# Patient Record
Sex: Female | Born: 1995
Health system: Southern US, Community
[De-identification: ages and names within clinical notes are randomized; demographics above are authoritative.]

## PROBLEM LIST (undated history)

## (undated) DIAGNOSIS — Z23 Encounter for immunization: Secondary | ICD-10-CM

## (undated) DIAGNOSIS — T7840XA Allergy, unspecified, initial encounter: Secondary | ICD-10-CM

## (undated) DIAGNOSIS — Z789 Other specified health status: Secondary | ICD-10-CM

## (undated) HISTORY — DX: Allergy, unspecified, initial encounter: T78.40XA

## (undated) HISTORY — DX: Other specified health status: Z78.9

## (undated) HISTORY — DX: Encounter for immunization: Z23

## (undated) HISTORY — PX: NO PAST SURGERIES: SHX2092

---
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
## 2021-09-23 ENCOUNTER — Ambulatory Visit (INDEPENDENT_AMBULATORY_CARE_PROVIDER_SITE_OTHER): Payer: Medicaid Other | Admitting: Advanced Practice Midwife

## 2021-09-23 ENCOUNTER — Other Ambulatory Visit (HOSPITAL_COMMUNITY)
Admission: RE | Admit: 2021-09-23 | Discharge: 2021-09-23 | Disposition: A | Discharge status: Home / Self Care | Payer: Medicaid Other | Source: Ambulatory Visit | Attending: Advanced Practice Midwife | Admitting: Advanced Practice Midwife

## 2021-09-23 ENCOUNTER — Other Ambulatory Visit: Payer: Self-pay

## 2021-09-23 ENCOUNTER — Other Ambulatory Visit (HOSPITAL_BASED_OUTPATIENT_CLINIC_OR_DEPARTMENT_OTHER): Payer: Self-pay

## 2021-09-23 ENCOUNTER — Encounter: Payer: Self-pay | Admitting: General Practice

## 2021-09-23 ENCOUNTER — Encounter: Payer: Self-pay | Admitting: Advanced Practice Midwife

## 2021-09-23 VITALS — BP 106/76 | HR 76 | Wt 137.0 lb

## 2021-09-23 DIAGNOSIS — Z3402 Encounter for supervision of normal first pregnancy, second trimester: Secondary | ICD-10-CM | POA: Insufficient documentation

## 2021-09-23 DIAGNOSIS — Z3400 Encounter for supervision of normal first pregnancy, unspecified trimester: Secondary | ICD-10-CM

## 2021-09-23 DIAGNOSIS — Z3A15 15 weeks gestation of pregnancy: Secondary | ICD-10-CM

## 2021-09-23 DIAGNOSIS — O0932 Supervision of pregnancy with insufficient antenatal care, second trimester: Secondary | ICD-10-CM | POA: Insufficient documentation

## 2021-09-23 MED ORDER — PRENATAL VITAMIN 27-0.8 MG PO TABS
1.0000 | ORAL_TABLET | Freq: Every day | ORAL | 12 refills | Status: DC
  Filled 2021-09-23: qty 100, 100d supply, fill #0

## 2021-09-23 NOTE — Progress Notes (Signed)
INITIAL OBSTETRICAL VISIT Patient name: Dawn Graves MRN [id number]  Date of birth: 1996/03/29 Chief Complaint:   Initial Prenatal Visit  History of Present Illness:   SAIRA KRAMME is a 25 y.o. G1P0 African American female at [redacted]w[redacted]d by [place] at 6 weeks with an Estimated Date of Delivery: 03/15/22 being seen today for her initial obstetrical visit.   Her obstetrical history is significant for slightly  late to care (15wks) .   Today she reports no complaints.    Patient's last menstrual period was 05/26/2021. Last pap today.   Review of Systems:   Pertinent items are noted in HPI Denies cramping/contractions, leakage of fluid, vaginal bleeding, abnormal vaginal discharge w/ itching/odor/irritation, headaches, visual changes, shortness of breath, chest pain, abdominal pain, severe nausea/vomiting, or problems with urination or bowel movements unless otherwise stated above.  Pertinent History Reviewed:  Reviewed past medical,surgical, social, obstetrical and family history.  Reviewed problem list, medications and allergies. OB History  Gravida Para Term Preterm AB Living  1            SAB IAB Ectopic Multiple Live Births               # Outcome Date GA Lbr Len/2nd Weight Sex Delivery Anes PTL Lv  1 Current            Physical Assessment:   Vitals:   09/23/21 0859  BP: 106/76  Pulse: 76  Weight: 137 lb (62.1 kg)  There is no height or weight on file to calculate BMI.       Physical Examination:  General appearance - well appearing, and in no distress  Mental status - alert, oriented to person, place, and time  Psych:  She has a normal mood and affect  Skin - warm and dry, normal color, no suspicious lesions noted  Chest - effort normal, all lung fields clear to auscultation bilaterally  Heart - normal rate and regular rhythm  Abdomen - soft, nontender  Extremities:  No swelling or varicosities noted  Pelvic - VULVA: normal appearing vulva with no masses, tenderness or  lesions  VAGINA: normal appearing vagina with normal color and discharge, no lesions  CERVIX: normal appearing cervix without discharge or lesions, no CMT  Thin prep pap is done   Chaperone:  Armandina Stammer RN      Assessment & Plan:  1) Low-Risk Pregnancy G1P0 at [redacted]w[redacted]d with an Estimated Date of Delivery: 03/15/22   2) Initial OB visit  3) Slightly late to care  Meds: PNV  Initial labs obtained Continue prenatal vitamins Reviewed n/v relief measures and warning s/s to report Reviewed recommended weight gain based on pre-gravid BMI Encouraged well-balanced diet Genetic & carrier screening discussed: requests Panorama, AFP, and Horizon , requests Panorama, AFP, and Horizon  Ultrasound discussed; fetal survey: requested CCNC completed> form faxed if has or is planning to apply for medicaid The nature of [employer] for [employer] with multiple MDs and other Advanced Practice Providers was explained to patient; also emphasized that fellows, residents, and students are part of our team. Given home bp cuff. . Check bp weekly, let us know if >140/90.   Indications for ASA therapy (per uptodate) One of the following: H/O preeclampsia, especially early onset/adverse outcome No Multifetal gestation No CHTN No T1DM or T2DM No Chronic kidney disease No Autoimmune disease (antiphospholipid syndrome, systemic lupus erythematosus) No  OR Two or more of the following: Nulliparity Yes Obesity (BMI>30 kg/m2) No Family  h/o preeclampsia in mother or sister No Age ?35 years No Sociodemographic characteristics (African American race, low socioeconomic level) Yes Personal risk factors (eg, previous pregnancy w/ LBW or SGA, previous adverse pregnancy outcome [eg, stillbirth], interval >10 years between pregnancies) No  Indications for early A1C (per uptodate) BMI >=25 (>=23 in Asian women) AND one of the following GDM in a previous pregnancy No Previous A1C?5.7, impaired glucose  tolerance, or impaired fasting glucose on previous testing No First-degree relative with diabetes No High-risk race/ethnicity (eg, African American, Latino, Native American, Asian American, Pacific Islander) Yes History of cardiovascular disease No HTN or on therapy for hypertension No HDL cholesterol level <35 mg/dL (4.54 mmol/L) and/or a triglyceride level >250 mg/dL (0.98 mmol/L) No PCOS No Physical inactivity No Other clinical condition associated with insulin resistance (eg, severe obesity, acanthosis nigricans) No Previous birth of an infant weighing ?4000 g No Previous stillbirth of unknown cause No >= 40yo No  Follow-up: Return in about 4 weeks (around 10/21/2021) for [employer].   Orders Placed This Encounter  Procedures   Urine Culture   [place] MFM OB COMP + 14 WK   CBC/D/Plt+RPR+Rh+ABO+RubIgG...   Hepatitis C Antibody   Genetic Screening    Wynelle Bourgeois CNM, [facility] 09/23/2021 9:49 AM

## 2021-09-23 NOTE — Progress Notes (Signed)
Patient states she was seen once in clinic in [place] and they gave her due date of 03/16/2022. Armandina Stammer RN

## 2021-09-24 LAB — CYTOLOGY - PAP
Chlamydia: NEGATIVE
Comment: NEGATIVE
Comment: NORMAL
Diagnosis: NEGATIVE
Neisseria Gonorrhea: NEGATIVE

## 2021-09-24 LAB — CBC/D/PLT+RPR+RH+ABO+RUBIGG...
Antibody Screen: NEGATIVE
Basophils Absolute: 0 10*3/uL (ref 0.0–0.2)
Basos: 0 %
EOS (ABSOLUTE): 0.6 10*3/uL — ABNORMAL HIGH (ref 0.0–0.4)
Eos: 5 %
HCV Ab: <0.1 s/co ratio (ref 0.0–0.9)
HIV Screen 4th Generation wRfx: NONREACTIVE
Hematocrit: 37.6 % (ref 34.0–46.6)
Hemoglobin: 13.1 g/dL (ref 11.1–15.9)
Hepatitis B Surface Ag: NEGATIVE
Immature Grans (Abs): 0.1 10*3/uL (ref 0.0–0.1)
Immature Granulocytes: 1 %
Lymphocytes Absolute: 1.9 10*3/uL (ref 0.7–3.1)
Lymphs: 16 %
MCH: 33.3 pg — ABNORMAL HIGH (ref 26.6–33.0)
MCHC: 34.8 g/dL (ref 31.5–35.7)
MCV: 96 fL (ref 79–97)
Monocytes Absolute: 0.6 10*3/uL (ref 0.1–0.9)
Monocytes: 5 %
Neutrophils Absolute: 9.2 10*3/uL — ABNORMAL HIGH (ref 1.4–7.0)
Neutrophils: 73 %
Platelets: 179 10*3/uL (ref 150–450)
RBC: 3.93 x10E6/uL (ref 3.77–5.28)
RDW: 12 % (ref 11.7–15.4)
RPR Ser Ql: NONREACTIVE
Rh Factor: POSITIVE
Rubella Antibodies, IGG: 4.47 index (ref >0.99)
WBC: 12.5 10*3/uL — ABNORMAL HIGH (ref 3.4–10.8)

## 2021-09-24 LAB — HCV INTERPRETATION

## 2021-09-24 LAB — HEPATITIS C ANTIBODY: Hep C Virus Ab: <0.1 s/co ratio (ref 0.0–0.9)

## 2021-09-25 ENCOUNTER — Other Ambulatory Visit: Payer: Self-pay

## 2021-09-26 LAB — URINE CULTURE

## 2021-10-06 ENCOUNTER — Telehealth: Payer: Self-pay

## 2021-10-06 NOTE — Telephone Encounter (Signed)
Called pt to discuss Panorama results. Pt made aware that her Dawn Graves is low risk. Pt does not want to know the gender.Understanding was voiced. Karter Haire l Arihant Pennings, CMA

## 2021-10-21 ENCOUNTER — Ambulatory Visit (INDEPENDENT_AMBULATORY_CARE_PROVIDER_SITE_OTHER): Payer: Medicaid Other | Admitting: Advanced Practice Midwife

## 2021-10-21 ENCOUNTER — Encounter: Payer: Self-pay | Admitting: Advanced Practice Midwife

## 2021-10-21 ENCOUNTER — Other Ambulatory Visit: Payer: Self-pay

## 2021-10-21 VITALS — BP 111/76 | HR 97 | Wt 138.0 lb

## 2021-10-21 DIAGNOSIS — Z3A19 19 weeks gestation of pregnancy: Secondary | ICD-10-CM | POA: Diagnosis not present

## 2021-10-21 DIAGNOSIS — Z3402 Encounter for supervision of normal first pregnancy, second trimester: Secondary | ICD-10-CM | POA: Diagnosis not present

## 2021-10-21 NOTE — Progress Notes (Signed)
   PRENATAL VISIT NOTE  Subjective:  Dawn Graves is a 25 y.o. G1P0 at [redacted]w[redacted]d being seen today for ongoing prenatal care.  She is currently monitored for the following issues for this low-risk pregnancy and has Sports physical; Right hamstring muscle strain; Encounter for supervision of normal first pregnancy in second trimester; and Late prenatal care affecting pregnancy in second trimester on their problem list.  Patient reports no complaints.  Contractions: Not present. Vag. Bleeding: None.  Movement: Present. Denies leaking of fluid.   The following portions of the patient's history were reviewed and updated as appropriate: allergies, current medications, past family history, past medical history, past social history, past surgical history and problem list.   Objective:   Vitals:   10/21/21 1102  BP: 111/76  Pulse: 97  Weight: 138 lb (62.6 kg)    Fetal Status: Fetal Heart Rate (bpm): 161   Movement: Present     General:  Alert, oriented and cooperative. Patient is in no acute distress.  Skin: Skin is warm and dry. No rash noted.   Cardiovascular: Normal heart rate noted  Respiratory: Normal respiratory effort, no problems with respiration noted  Abdomen: Soft, gravid, appropriate for gestational age.  Pain/Pressure: Absent     Pelvic: Cervical exam deferred        Extremities: Normal range of motion.  Edema: None  Mental Status: Normal mood and affect. Normal behavior. Normal judgment and thought content.   Assessment and Plan:  Pregnancy: G1P0 at [redacted]w[redacted]d 1. [redacted] weeks gestation of pregnancy  - AFP, Serum, Open Spina Bifida  2. Encounter for supervision of normal first pregnancy in second trimester      Panorama low risk  - AFP, Serum, Open Spina Bifida  Preterm labor symptoms and general obstetric precautions including but not limited to vaginal bleeding, contractions, leaking of fluid and fetal movement were reviewed in detail with the patient. Please refer to After  Visit Summary for other counseling recommendations.   Return in about 4 weeks (around 11/18/2021) for [employer].  Future Appointments  Date Time Provider Department Center  10/22/2021 10:45 AM [facility] US5 [facility]    Wynelle Bourgeois, CNM

## 2021-10-22 ENCOUNTER — Ambulatory Visit (HOSPITAL_BASED_OUTPATIENT_CLINIC_OR_DEPARTMENT_OTHER): Payer: Medicaid Other | Admitting: Maternal & Fetal Medicine

## 2021-10-22 ENCOUNTER — Ambulatory Visit: Payer: Medicaid Other | Attending: Advanced Practice Midwife

## 2021-10-22 ENCOUNTER — Other Ambulatory Visit: Payer: Self-pay | Admitting: Advanced Practice Midwife

## 2021-10-22 DIAGNOSIS — Z3A19 19 weeks gestation of pregnancy: Secondary | ICD-10-CM | POA: Diagnosis not present

## 2021-10-22 DIAGNOSIS — O3503X0 Maternal care for (suspected) central nervous system malformation or damage in fetus, choroid plexus cysts, not applicable or unspecified: Secondary | ICD-10-CM | POA: Diagnosis not present

## 2021-10-22 DIAGNOSIS — Z3689 Encounter for other specified antenatal screening: Secondary | ICD-10-CM | POA: Diagnosis not present

## 2021-10-22 DIAGNOSIS — O358XX0 Maternal care for other (suspected) fetal abnormality and damage, not applicable or unspecified: Secondary | ICD-10-CM

## 2021-10-22 DIAGNOSIS — Z3400 Encounter for supervision of normal first pregnancy, unspecified trimester: Secondary | ICD-10-CM

## 2021-10-22 DIAGNOSIS — Z3A15 15 weeks gestation of pregnancy: Secondary | ICD-10-CM | POA: Diagnosis not present

## 2021-10-22 NOTE — Progress Notes (Signed)
Ms. Slabach is a G1P0 here at 19w 3d who is dated by a 6 w 3 d ultrasound.   Single intrauterine pregnancy here for a detailed anatomy due to bilateral choroid plexus cyst. Normal anatomy with measurements consistent with dates There is good fetal movement and amniotic fluid volume  Ms. McKnigh had a low risk NIPS  Choroid plexus cysts (CPCs) are well-demarcated, anechoic, fluid-filled structures within the choroid plexus of the lateral ventricles of the brain. They are not true cysts in the pathologic sense. CPCs are often called "soft sonographic signs" or "markers" of aneuploidy, because some studies have found an association between them and fetal chromosomal abnormalities.    When the CPCs are isolated, some consider them an anatomic variant.  The incidence ranges from 0.18% to 3.6% of fetuses scanned in the second trimester. Regardless, the potential clinical implications were reviewed with your patient. CPCs may be an isolated finding or associated with fetal anomalies. Associated anomalies are typically those seen with trisomy 18 and include congenital heart disease, clenched hands, single umbilical artery, intrauterine growth restriction, and rocker bottom feet. A CPC is not a congenital brain defect.  In general, when a choroid plexus cyst is noted as an isolated finding and no other high-risk issues are noted, invasive karyotype analysis via an amniocentesis is usually not recommended.   Ms. Brackeen understood today's counseling and had no further questions  I spent 20 minutes with > 50% in face to face counseling.  Novella Olive, MD

## 2021-10-23 LAB — AFP, SERUM, OPEN SPINA BIFIDA
AFP MoM: 0.83
AFP Value: 51.6 ng/mL
Gest. Age on Collection Date: 19.2 weeks
Maternal Age At EDD: 23.6 yr
OSBR Risk 1 IN: 10000
Test Results:: NEGATIVE
Weight: 138 [lb_av]

## 2021-11-13 NOTE — Progress Notes (Signed)
   PRENATAL VISIT NOTE  Subjective:  Dawn Graves is a 25 y.o. G1P0 at [redacted]w[redacted]d being seen today for ongoing prenatal care.  She is currently monitored for the following issues for this low-risk pregnancy and has Sports physical; Right hamstring muscle strain; Encounter for supervision of normal first pregnancy in second trimester; and Late prenatal care affecting pregnancy in second trimester on their problem list.  Patient reports no complaints.  Contractions: Not present. Vag. Bleeding: None.  Movement: Present. Denies leaking of fluid.   The following portions of the patient's history were reviewed and updated as appropriate: allergies, current medications, past family history, past medical history, past social history, past surgical history and problem list.   Objective:   Vitals:   11/19/21 1032  BP: 117/73  Pulse: (!) 104  Weight: 144 lb (65.3 kg)    Fetal Status: Fetal Heart Rate (bpm): 140 Fundal Height: 23 cm Movement: Present     General:  Alert, oriented and cooperative. Patient is in no acute distress.  Skin: Skin is warm and dry. No rash noted.   Cardiovascular: Normal heart rate noted  Respiratory: Normal respiratory effort, no problems with respiration noted  Abdomen: Soft, gravid, appropriate for gestational age.  Pain/Pressure: Present     Pelvic: Cervical exam deferred        Extremities: Normal range of motion.  Edema: None  Mental Status: Normal mood and affect. Normal behavior. Normal judgment and thought content.   Assessment and Plan:  Pregnancy: G1P0 at [redacted]w[redacted]d 1. Encounter for supervision of normal first pregnancy in second trimester Anatomy showed CPCs but isolated finding and normal NIPS. Discussed with pt.  MSAFP wnl 28w labs at next appt  Preterm labor symptoms and general obstetric precautions including but not limited to vaginal bleeding, contractions, leaking of fluid and fetal movement were reviewed in detail with the patient. Please refer to  After Visit Summary for other counseling recommendations.   Return in about 4 weeks (around 12/17/2021) for 2 hr GTT.  No future appointments.   Milas Hock, MD

## 2021-11-19 ENCOUNTER — Other Ambulatory Visit: Payer: Self-pay

## 2021-11-19 ENCOUNTER — Encounter: Payer: Self-pay | Admitting: Obstetrics and Gynecology

## 2021-11-19 ENCOUNTER — Ambulatory Visit (INDEPENDENT_AMBULATORY_CARE_PROVIDER_SITE_OTHER): Payer: Medicaid Other | Admitting: Obstetrics and Gynecology

## 2021-11-19 VITALS — BP 117/73 | HR 104 | Wt 144.0 lb

## 2021-11-19 DIAGNOSIS — Z3402 Encounter for supervision of normal first pregnancy, second trimester: Secondary | ICD-10-CM

## 2021-11-19 DIAGNOSIS — Z3A23 23 weeks gestation of pregnancy: Secondary | ICD-10-CM

## 2021-12-14 NOTE — L&D Delivery Note (Signed)
Delivery Note ? ? ?FHR 180, moderate variability and deep variables down to 90s w/pushes. Vtx visible at introitus for about an hour, plenty of room, but maternal pushing effort poor.  Offered a vacuum and pt accepted. Dr Shawnie Pons in room. ?Verbal consent: obtained from patient. Risks and benefits discussed in detail. Risks include, but are not limited to the risks of anesthesia, bleeding, infection, damage to maternal tissues, fetal cephalhematoma. There is also the risk of inability to effect vaginal delivery of the head, or shoulder dystocia that cannot be resolved by established maneuvers, leading to the need for emergency cesarean section ?Kiwi placed and pumped to green range.  Over the next contraction, at 10:46 PM a viable female was delivered via Vaginal, Vacuum Investment banker, operational) (Presentation: Middle Occiput Anterior).  APGAR: 8, 9; weight 6 lb 5.9 oz (2890 g).  After 1 minute, the cord was clamped and cut. 40 units of pitocin diluted in 1000cc LR was infused rapidly IV.  The placenta separated spontaneously and delivered via CCT and maternal pushing effort.  It was inspected and appears to be intact with a 3 VC.  ? ?Anesthesia: Epidural ?Episiotomy: None ?Lacerations: 2nd degree ?Suture Repair: 2.0 vicryl ?Est. Blood Loss (mL):   ? ?Mom to postpartum.  Baby to NICU. ? ?Dawn Graves ?03/19/2022, 11:50 PM ? ? ? ?

## 2021-12-16 ENCOUNTER — Other Ambulatory Visit: Payer: Self-pay

## 2021-12-16 ENCOUNTER — Ambulatory Visit (INDEPENDENT_AMBULATORY_CARE_PROVIDER_SITE_OTHER): Payer: Medicaid Other | Admitting: Advanced Practice Midwife

## 2021-12-16 ENCOUNTER — Encounter: Payer: Self-pay | Admitting: General Practice

## 2021-12-16 VITALS — BP 103/76 | HR 83 | Wt 142.0 lb

## 2021-12-16 DIAGNOSIS — Z3A27 27 weeks gestation of pregnancy: Secondary | ICD-10-CM

## 2021-12-16 DIAGNOSIS — Z3402 Encounter for supervision of normal first pregnancy, second trimester: Secondary | ICD-10-CM

## 2021-12-16 NOTE — Progress Notes (Signed)
° °  PRENATAL VISIT NOTE  Subjective:  Dawn Graves is a 26 y.o. G1P0 at [redacted]w[redacted]d being seen today for ongoing prenatal care.  She is currently monitored for the following issues for this low-risk pregnancy and has Sports physical; Right hamstring muscle strain; Encounter for supervision of normal first pregnancy in second trimester; and Late prenatal care affecting pregnancy in second trimester on their problem list.  Patient reports no complaints.  Contractions: Not present. Vag. Bleeding: None.  Movement: Present. Denies leaking of fluid.   The following portions of the patient's history were reviewed and updated as appropriate: allergies, current medications, past family history, past medical history, past social history, past surgical history and problem list.   Objective:   Vitals:   12/16/21 1044  BP: 103/76  Pulse: 83  Weight: 142 lb (64.4 kg)    Fetal Status: Fetal Heart Rate (bpm): 147   Movement: Present     General:  Alert, oriented and cooperative. Patient is in no acute distress.  Skin: Skin is warm and dry. No rash noted.   Cardiovascular: Normal heart rate noted  Respiratory: Normal respiratory effort, no problems with respiration noted  Abdomen: Soft, gravid, appropriate for gestational age.  Pain/Pressure: Present     Pelvic: Cervical exam deferred        Extremities: Normal range of motion.  Edema: None  Mental Status: Normal mood and affect. Normal behavior. Normal judgment and thought content.   Assessment and Plan:  Pregnancy: G1P0 at [redacted]w[redacted]d 1. Encounter for supervision of normal first pregnancy in second trimester  - CBC - Glucose Tolerance, 2 Hours w/1 Hour - HIV Antibody (routine testing w rflx) - RPR  2. [redacted] weeks gestation of pregnancy  - CBC - Glucose Tolerance, 2 Hours w/1 Hour - HIV Antibody (routine testing w rflx) - RPR  Preterm labor symptoms and general obstetric precautions including but not limited to vaginal bleeding, contractions,  leaking of fluid and fetal movement were reviewed in detail with the patient. Please refer to After Visit Summary for other counseling recommendations.   Return in about 2 weeks (around 12/30/2021) for [facility].  Future Appointments  Date Time Provider Department Center  12/30/2021 10:15 AM Aviva Signs, CNM [facility] None  01/13/2022 10:35 AM Aviva Signs, CNM [facility] None  01/27/2022 10:35 AM Aviva Signs, CNM [facility] None  02/09/2022 10:35 AM Milas Hock, MD [facility] None    Wynelle Bourgeois, CNM

## 2021-12-17 LAB — CBC
Hematocrit: 35.4 % (ref 34.0–46.6)
Hemoglobin: 12.5 g/dL (ref 11.1–15.9)
MCH: 33.1 pg — ABNORMAL HIGH (ref 26.6–33.0)
MCHC: 35.3 g/dL (ref 31.5–35.7)
MCV: 94 fL (ref 79–97)
Platelets: 173 10*3/uL (ref 150–450)
RBC: 3.78 x10E6/uL (ref 3.77–5.28)
RDW: 11.5 % — ABNORMAL LOW (ref 11.7–15.4)
WBC: 13.5 10*3/uL — ABNORMAL HIGH (ref 3.4–10.8)

## 2021-12-17 LAB — RPR: RPR Ser Ql: NONREACTIVE

## 2021-12-17 LAB — GLUCOSE TOLERANCE, 2 HOURS W/ 1HR
Glucose, 1 hour: 84 mg/dL (ref 70–179)
Glucose, 2 hour: 62 mg/dL — ABNORMAL LOW (ref 70–152)
Glucose, Fasting: 67 mg/dL — ABNORMAL LOW (ref 70–91)

## 2021-12-17 LAB — HIV ANTIBODY (ROUTINE TESTING W REFLEX): HIV Screen 4th Generation wRfx: NONREACTIVE

## 2021-12-30 ENCOUNTER — Other Ambulatory Visit: Payer: Self-pay

## 2021-12-30 ENCOUNTER — Ambulatory Visit (INDEPENDENT_AMBULATORY_CARE_PROVIDER_SITE_OTHER): Payer: Medicaid Other | Admitting: Advanced Practice Midwife

## 2021-12-30 ENCOUNTER — Encounter: Payer: Self-pay | Admitting: Advanced Practice Midwife

## 2021-12-30 VITALS — BP 102/68 | HR 120 | Wt 144.0 lb

## 2021-12-30 DIAGNOSIS — Z3402 Encounter for supervision of normal first pregnancy, second trimester: Secondary | ICD-10-CM

## 2021-12-30 DIAGNOSIS — Z3A29 29 weeks gestation of pregnancy: Secondary | ICD-10-CM

## 2021-12-31 NOTE — Progress Notes (Signed)
° °  PRENATAL VISIT NOTE  Subjective:  Dawn Graves is a 26 y.o. G1P0 at [redacted]w[redacted]d being seen today for ongoing prenatal care.  She is currently monitored for the following issues for this low-risk pregnancy and has Sports physical; Right hamstring muscle strain; Encounter for supervision of normal first pregnancy in second trimester; and Late prenatal care affecting pregnancy in second trimester on their problem list.  Patient reports no complaints.  Contractions: Not present. Vag. Bleeding: None.  Movement: Present. Denies leaking of fluid.   The following portions of the patient's history were reviewed and updated as appropriate: allergies, current medications, past family history, past medical history, past social history, past surgical history and problem list.   Objective:   Vitals:   12/30/21 1020  BP: 102/68  Pulse: (!) 120  Weight: 144 lb (65.3 kg)    Fetal Status: Fetal Heart Rate (bpm): 150   Movement: Present     General:  Alert, oriented and cooperative. Patient is in no acute distress.  Skin: Skin is warm and dry. No rash noted.   Cardiovascular: Normal heart rate noted  Respiratory: Normal respiratory effort, no problems with respiration noted  Abdomen: Soft, gravid, appropriate for gestational age.  Pain/Pressure: Present     Pelvic: Cervical exam deferred        Extremities: Normal range of motion.  Edema: None  Mental Status: Normal mood and affect. Normal behavior. Normal judgment and thought content.   Assessment and Plan:  Pregnancy: G1P0 at [redacted]w[redacted]d There are no diagnoses linked to this encounter. Preterm labor symptoms and general obstetric precautions including but not limited to vaginal bleeding, contractions, leaking of fluid and fetal movement were reviewed in detail with the patient. Please refer to After Visit Summary for other counseling recommendations.   Return in about 2 weeks (around 01/13/2022) for [facility].  Future Appointments  Date  Time Provider Department Center  01/13/2022 10:35 AM Aviva Signs, CNM [facility] None  01/27/2022 10:35 AM Aviva Signs, CNM [facility] None  02/09/2022 10:35 AM Milas Hock, MD [facility] None    Wynelle Bourgeois, CNM

## 2022-01-13 ENCOUNTER — Ambulatory Visit (INDEPENDENT_AMBULATORY_CARE_PROVIDER_SITE_OTHER): Payer: Medicaid Other | Admitting: Advanced Practice Midwife

## 2022-01-13 ENCOUNTER — Other Ambulatory Visit: Payer: Self-pay

## 2022-01-13 ENCOUNTER — Encounter: Payer: Self-pay | Admitting: Advanced Practice Midwife

## 2022-01-13 VITALS — BP 104/66 | HR 108 | Wt 148.0 lb

## 2022-01-13 DIAGNOSIS — Z3A31 31 weeks gestation of pregnancy: Secondary | ICD-10-CM | POA: Diagnosis not present

## 2022-01-13 DIAGNOSIS — Z23 Encounter for immunization: Secondary | ICD-10-CM

## 2022-01-13 DIAGNOSIS — Z3493 Encounter for supervision of normal pregnancy, unspecified, third trimester: Secondary | ICD-10-CM

## 2022-01-13 NOTE — Progress Notes (Signed)
° °  PRENATAL VISIT NOTE  Subjective:  Dawn Graves is a 26 y.o. G1P0 at 105w2d being seen today for ongoing prenatal care.  She is currently monitored for the following issues for this low-risk pregnancy and has Sports physical; Right hamstring muscle strain; Encounter for supervision of normal first pregnancy in second trimester; and Late prenatal care affecting pregnancy in second trimester on their problem list.  Patient reports no complaints.  Contractions: Not present. Vag. Bleeding: None.  Movement: Present. Denies leaking of fluid.   The following portions of the patient's history were reviewed and updated as appropriate: allergies, current medications, past family history, past medical history, past social history, past surgical history and problem list.   Objective:   Vitals:   01/13/22 1042  BP: 104/66  Pulse: (!) 108  Weight: 148 lb (67.1 kg)    Fetal Status: Fetal Heart Rate (bpm): 137   Movement: Present     General:  Alert, oriented and cooperative. Patient is in no acute distress.  Skin: Skin is warm and dry. No rash noted.   Cardiovascular: Normal heart rate noted  Respiratory: Normal respiratory effort, no problems with respiration noted  Abdomen: Soft, gravid, appropriate for gestational age.  Pain/Pressure: Absent     Pelvic: Cervical exam deferred        Extremities: Normal range of motion.  Edema: Trace  Mental Status: Normal mood and affect. Normal behavior. Normal judgment and thought content.   Assessment and Plan:  Pregnancy: G1P0 at [redacted]w[redacted]d 1. [redacted] weeks gestation of pregnancy      Signed Waterbirth Consent today       Reviewed issues surrounding waterbirth - Tdap vaccine greater than or equal to 7yo IM  Preterm labor symptoms and general obstetric precautions including but not limited to vaginal bleeding, contractions, leaking of fluid and fetal movement were reviewed in detail with the patient. Please refer to After Visit Summary for other counseling  recommendations.     Future Appointments  Date Time Provider Department Center  01/27/2022 10:35 AM Aviva Signs, CNM [facility] None  02/09/2022 10:35 AM Milas Hock, MD [facility] None    Wynelle Bourgeois, CNM

## 2022-01-14 ENCOUNTER — Encounter: Payer: Self-pay | Admitting: General Practice

## 2022-01-18 ENCOUNTER — Encounter: Payer: Self-pay | Admitting: Advanced Practice Midwife

## 2022-01-20 ENCOUNTER — Telehealth: Payer: Self-pay

## 2022-01-20 ENCOUNTER — Encounter: Payer: Self-pay | Admitting: Advanced Practice Midwife

## 2022-01-20 ENCOUNTER — Other Ambulatory Visit: Payer: Self-pay

## 2022-01-20 DIAGNOSIS — O368120 Decreased fetal movements, second trimester, not applicable or unspecified: Secondary | ICD-10-CM

## 2022-01-20 NOTE — Telephone Encounter (Signed)
Patient called and made aware that we are working on mfm consult for the concerns over her fetal movements. Armandina Stammer RN

## 2022-01-27 ENCOUNTER — Other Ambulatory Visit: Payer: Self-pay

## 2022-01-27 ENCOUNTER — Ambulatory Visit: Payer: Medicaid Other

## 2022-01-27 ENCOUNTER — Other Ambulatory Visit: Payer: Self-pay | Admitting: Advanced Practice Midwife

## 2022-01-27 ENCOUNTER — Ambulatory Visit (HOSPITAL_BASED_OUTPATIENT_CLINIC_OR_DEPARTMENT_OTHER): Payer: Medicaid Other

## 2022-01-27 ENCOUNTER — Encounter: Payer: Self-pay | Admitting: Advanced Practice Midwife

## 2022-01-27 ENCOUNTER — Ambulatory Visit (INDEPENDENT_AMBULATORY_CARE_PROVIDER_SITE_OTHER): Payer: Medicaid Other | Admitting: Advanced Practice Midwife

## 2022-01-27 ENCOUNTER — Ambulatory Visit: Payer: Medicaid Other | Attending: Advanced Practice Midwife | Admitting: *Deleted

## 2022-01-27 VITALS — BP 93/65 | HR 94

## 2022-01-27 VITALS — BP 101/65 | HR 75 | Wt 147.0 lb

## 2022-01-27 DIAGNOSIS — Z3402 Encounter for supervision of normal first pregnancy, second trimester: Secondary | ICD-10-CM

## 2022-01-27 DIAGNOSIS — Z3A33 33 weeks gestation of pregnancy: Secondary | ICD-10-CM

## 2022-01-27 DIAGNOSIS — O368130 Decreased fetal movements, third trimester, not applicable or unspecified: Secondary | ICD-10-CM | POA: Diagnosis not present

## 2022-01-27 DIAGNOSIS — O368120 Decreased fetal movements, second trimester, not applicable or unspecified: Secondary | ICD-10-CM

## 2022-01-27 DIAGNOSIS — Z362 Encounter for other antenatal screening follow-up: Secondary | ICD-10-CM | POA: Diagnosis not present

## 2022-01-27 NOTE — Progress Notes (Signed)
° °  PRENATAL VISIT NOTE  Subjective:  Dawn Graves is a 26 y.o. G1P0 at [redacted]w[redacted]d being seen today for ongoing prenatal care.  She is currently monitored for the following issues for this low-risk pregnancy and has Sports physical; Right hamstring muscle strain; Encounter for supervision of normal first pregnancy in second trimester; and Late prenatal care affecting pregnancy in second trimester on their problem list.  Patient reports no complaints.  Contractions: Not present. Vag. Bleeding: None.  Movement: Present. Denies leaking of fluid.   The following portions of the patient's history were reviewed and updated as appropriate: allergies, current medications, past family history, past medical history, past social history, past surgical history and problem list.   Objective:   Vitals:   01/27/22 1039  BP: 101/65  Pulse: 75  Weight: 147 lb (66.7 kg)    Fetal Status: Fetal Heart Rate (bpm): 132   Movement: Present     General:  Alert, oriented and cooperative. Patient is in no acute distress.  Skin: Skin is warm and dry. No rash noted.   Cardiovascular: Normal heart rate noted  Respiratory: Normal respiratory effort, no problems with respiration noted  Abdomen: Soft, gravid, appropriate for gestational age.  Pain/Pressure: Present     Pelvic: Cervical exam deferred        Extremities: Normal range of motion.  Edema: None  Mental Status: Normal mood and affect. Normal behavior. Normal judgment and thought content.   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 Preterm labor symptoms and general obstetric precautions including but not limited to vaginal bleeding, contractions, leaking of fluid and fetal movement were reviewed in detail with the patient. Please refer to After Visit Summary for other counseling recommendations.   Return in about 2 weeks (around 02/10/2022) for [facility].  Future Appointments  Date Time Provider Department Center  02/09/2022 10:35 AM Milas Hock, MD [facility] None  02/17/2022 10:35 AM Gerrit Heck, CNM [facility] None  02/24/2022 10:35 AM Gerrit Heck, CNM [facility] None  02/24/2022  1:15 PM [facility] NURSE [facility]  02/24/2022  1:30 PM [facility] US3 [facility]  03/03/2022 11:15 AM Gerrit Heck, CNM [facility] None  03/10/2022  9:15 AM Aviva Signs, CNM [facility] None    Wynelle Bourgeois, CNM

## 2022-01-28 ENCOUNTER — Other Ambulatory Visit: Payer: Self-pay | Admitting: *Deleted

## 2022-01-28 DIAGNOSIS — O283 Abnormal ultrasonic finding on antenatal screening of mother: Secondary | ICD-10-CM

## 2022-02-02 ENCOUNTER — Ambulatory Visit: Payer: Medicaid Other

## 2022-02-09 ENCOUNTER — Ambulatory Visit (INDEPENDENT_AMBULATORY_CARE_PROVIDER_SITE_OTHER): Payer: Medicaid Other | Admitting: Obstetrics and Gynecology

## 2022-02-09 ENCOUNTER — Other Ambulatory Visit: Payer: Self-pay

## 2022-02-09 VITALS — BP 105/64 | HR 87 | Wt 151.0 lb

## 2022-02-09 DIAGNOSIS — Z3402 Encounter for supervision of normal first pregnancy, second trimester: Secondary | ICD-10-CM

## 2022-02-09 DIAGNOSIS — Z3A35 35 weeks gestation of pregnancy: Secondary | ICD-10-CM

## 2022-02-09 DIAGNOSIS — O0932 Supervision of pregnancy with insufficient antenatal care, second trimester: Secondary | ICD-10-CM

## 2022-02-09 NOTE — Progress Notes (Addendum)
° °  PRENATAL VISIT NOTE  Subjective:  Dawn Graves is a 26 y.o. G1P0 at [redacted]w[redacted]d being seen today for ongoing prenatal care.  She is currently monitored for the following issues for this low-risk pregnancy and has Sports physical; Right hamstring muscle strain; Encounter for supervision of normal first pregnancy in second trimester; and Late prenatal care affecting pregnancy in second trimester on their problem list.  Patient reports no complaints.  Contractions: Irritability. Vag. Bleeding: None.  Movement: Present. Denies leaking of fluid.   The following portions of the patient's history were reviewed and updated as appropriate: allergies, current medications, past family history, past medical history, past social history, past surgical history and problem list.   Objective:   Vitals:   02/09/22 1039  BP: 105/64  Pulse: 87  Weight: 151 lb (68.5 kg)    Fetal Status: Fetal Heart Rate (bpm): 145 Fundal Height: 34 cm Movement: Present     General:  Alert, oriented and cooperative. Patient is in no acute distress.  Skin: Skin is warm and dry. No rash noted.   Cardiovascular: Normal heart rate noted  Respiratory: Normal respiratory effort, no problems with respiration noted  Abdomen: Soft, gravid, appropriate for gestational age.  Pain/Pressure: Absent     Pelvic: Cervical exam deferred        Extremities: Normal range of motion.  Edema: None  Mental Status: Normal mood and affect. Normal behavior. Normal judgment and thought content.   Assessment and Plan:  Pregnancy: G1P0 at [redacted]w[redacted]d 1. Encounter for supervision of normal first pregnancy in second trimester - GBS nv - normal gtt - Choroid plexus cysts resolved, growth was normal (4lbl13oz, 44%ile). Has f/u growth on 3/14 for her reassurance.   2. Late prenatal care affecting pregnancy in second trimester   Preterm labor symptoms and general obstetric precautions including but not limited to vaginal bleeding, contractions, leaking  of fluid and fetal movement were reviewed in detail with the patient. Please refer to After Visit Summary for other counseling recommendations.   No follow-ups on file.  Future Appointments  Date Time Provider Department Center  02/17/2022 10:35 AM Gerrit Heck, CNM [facility] None  02/24/2022 10:35 AM Gerrit Heck, CNM [facility] None  02/24/2022  1:15 PM [facility] NURSE [facility]  02/24/2022  1:30 PM [facility] US3 [facility]  03/03/2022 11:15 AM Gerrit Heck, CNM [facility] None  03/10/2022  9:15 AM Aviva Signs, CNM [facility] None    Milas Hock, MD

## 2022-02-17 ENCOUNTER — Other Ambulatory Visit: Payer: Self-pay

## 2022-02-17 ENCOUNTER — Other Ambulatory Visit (HOSPITAL_COMMUNITY)
Admission: RE | Admit: 2022-02-17 | Discharge: 2022-02-17 | Disposition: A | Discharge status: Home / Self Care | Payer: Medicaid Other | Source: Ambulatory Visit

## 2022-02-17 ENCOUNTER — Ambulatory Visit (INDEPENDENT_AMBULATORY_CARE_PROVIDER_SITE_OTHER): Payer: Medicaid Other

## 2022-02-17 VITALS — BP 105/68 | HR 71 | Wt 150.0 lb

## 2022-02-17 DIAGNOSIS — Z3402 Encounter for supervision of normal first pregnancy, second trimester: Secondary | ICD-10-CM

## 2022-02-17 DIAGNOSIS — Z3A36 36 weeks gestation of pregnancy: Secondary | ICD-10-CM | POA: Diagnosis not present

## 2022-02-17 DIAGNOSIS — O26843 Uterine size-date discrepancy, third trimester: Secondary | ICD-10-CM

## 2022-02-17 NOTE — Progress Notes (Signed)
? ?  LOW-RISK PREGNANCY OFFICE VISIT ? ?Patient name: Dawn Graves MRN [id number]  Date of birth: June 23, 1996 ?Chief Complaint:   ?Routine Prenatal Visit ? ?Subjective:   ?Dawn Graves is a 26 y.o. G1P0 female at [redacted]w[redacted]d with an Estimated Date of Delivery: 03/15/22 being seen today for ongoing management of a low-risk pregnancy aeb has Sports physical; Right hamstring muscle strain; Encounter for supervision of normal first pregnancy in second trimester; and Late prenatal care affecting pregnancy in second trimester on their problem list. ? ?Patient presents today with, her mother, and has no complaints.  Patient endorses fetal movement. Patient is unsure if she is experiencing abdominal cramping or contractions.  She reports "little discomforts." Patient denies vaginal concerns including abnormal discharge, leaking of fluid, and bleeding.  Contractions: Irritability. Vag. Bleeding: None.  Movement: Present. ? ?Reviewed past medical,surgical, social, obstetrical and family history as well as problem list, medications and allergies. ? ?Objective  ? ?Vitals:  ? 02/17/22 1043  ?BP: 105/68  ?Pulse: 71  ?Weight: 150 lb (68 kg)  ?There is no height or weight on file to calculate BMI.  ?Total Weight Gain:28 lb (12.7 kg) ? ?  ?     Physical Examination:  ? General appearance: Well appearing, and in no distress ? Mental status: Alert, oriented to person, place, and time ? Skin: Warm & dry ? Cardiovascular: Normal heart rate noted ? Respiratory: Normal respiratory effort, no distress ? Abdomen: Soft, gravid, nontender, AGA with Fundal Height: 31 cm ? Pelvic: Cervical exam deferred          ? Extremities: Edema: Trace ? ?Fetal Status: Fetal Heart Rate (bpm): 136  Movement: Present  ? ?No results found for this or any previous visit (from the past 24 hour(s)).  ?Assessment & Plan:  ?Low-risk pregnancy of a 26 y.o., G1P0 at [redacted]w[redacted]d with an Estimated Date of Delivery: 03/15/22  ? ?1. Encounter for supervision of normal first  pregnancy in second trimester ?-Anticipatory guidance for upcoming appts. ?-Patient to schedule next appt in 1 weeks for an in-person visit. ? ? ?3. [redacted] weeks gestation of pregnancy ?-Doing Well ?-GBS and Gc/CT cultures collected ?-Continues to desire WB. Remains candidate ? ? ?4. Uterine size-date discrepancy in third trimester ?-FH at 31cm which is decrease from previous measurements. ?-Leopolds without distinguishable presenting part in pelvic. ?-Patient informed that discrepancy could be from fetal position or small infant. ?-Next [place] scheduled for March 14. ? ? ?  ?Meds: No orders of the defined types were placed in this encounter. ? ?Labs/procedures today: Lab Orders    ?     Culture, beta strep (group b only)     ? ?Reviewed: Preterm labor symptoms and general obstetric precautions including but not limited to vaginal bleeding, contractions, leaking of fluid and fetal movement were reviewed in detail with the patient.  All questions were answered. ? ?Follow-up: Return in about 1 week (around 02/24/2022) for LROB. ? ?Orders Placed This Encounter  ?Procedures  ? Culture, beta strep (group b only)  ? ?Cherre Robins MSN, CNM ?02/17/2022 ? ?

## 2022-02-18 LAB — GC/CHLAMYDIA PROBE AMP (~~LOC~~) NOT AT ARMC
Chlamydia: NEGATIVE
Comment: NEGATIVE
Comment: NORMAL
Neisseria Gonorrhea: NEGATIVE

## 2022-02-21 LAB — CULTURE, BETA STREP (GROUP B ONLY): Strep Gp B Culture: NEGATIVE

## 2022-02-24 ENCOUNTER — Ambulatory Visit: Payer: Medicaid Other | Admitting: *Deleted

## 2022-02-24 ENCOUNTER — Other Ambulatory Visit: Payer: Self-pay | Admitting: Obstetrics and Gynecology

## 2022-02-24 ENCOUNTER — Other Ambulatory Visit: Payer: Self-pay

## 2022-02-24 ENCOUNTER — Ambulatory Visit (INDEPENDENT_AMBULATORY_CARE_PROVIDER_SITE_OTHER): Payer: Medicaid Other

## 2022-02-24 ENCOUNTER — Ambulatory Visit: Payer: Medicaid Other | Attending: Obstetrics and Gynecology

## 2022-02-24 VITALS — BP 101/75 | HR 94 | Wt 154.0 lb

## 2022-02-24 VITALS — BP 107/64 | HR 69

## 2022-02-24 DIAGNOSIS — Z362 Encounter for other antenatal screening follow-up: Secondary | ICD-10-CM | POA: Diagnosis not present

## 2022-02-24 DIAGNOSIS — O368130 Decreased fetal movements, third trimester, not applicable or unspecified: Secondary | ICD-10-CM | POA: Diagnosis not present

## 2022-02-24 DIAGNOSIS — O26843 Uterine size-date discrepancy, third trimester: Secondary | ICD-10-CM

## 2022-02-24 DIAGNOSIS — Z3A37 37 weeks gestation of pregnancy: Secondary | ICD-10-CM

## 2022-02-24 DIAGNOSIS — Z9289 Personal history of other medical treatment: Secondary | ICD-10-CM | POA: Insufficient documentation

## 2022-02-24 DIAGNOSIS — O358XX0 Maternal care for other (suspected) fetal abnormality and damage, not applicable or unspecified: Secondary | ICD-10-CM

## 2022-02-24 DIAGNOSIS — Z3402 Encounter for supervision of normal first pregnancy, second trimester: Secondary | ICD-10-CM

## 2022-02-24 DIAGNOSIS — O283 Abnormal ultrasonic finding on antenatal screening of mother: Secondary | ICD-10-CM | POA: Insufficient documentation

## 2022-02-24 DIAGNOSIS — O4103X1 Oligohydramnios, third trimester, fetus 1: Secondary | ICD-10-CM | POA: Insufficient documentation

## 2022-02-24 NOTE — Progress Notes (Signed)
? ?  LOW-RISK PREGNANCY OFFICE VISIT ? ?Patient name: Dawn Graves MRN [id number]  Date of birth: 1996-04-16 ?Chief Complaint:   ?Routine Prenatal Visit ? ?Subjective:   ?Dawn Graves is a 26 y.o. G1P0 female at [redacted]w[redacted]d with an Estimated Date of Delivery: 03/15/22 being seen today for ongoing management of a low-risk pregnancy aeb has Sports physical; Right hamstring muscle strain; Encounter for supervision of normal first pregnancy in second trimester; and Late prenatal care affecting pregnancy in second trimester on their problem list. ? ?Patient presents today with, her mother, and has no complaints.  Patient endorses fetal movement. Patient denies abdominal cramping or contractions.  Patient denies vaginal concerns including abnormal discharge, leaking of fluid, and bleeding. No issues with urination or diarrhea, but endorses constipation with hard to pass stools.   Contractions: Not present. Vag. Bleeding: None.  Movement: Present. ? ?Reviewed past medical,surgical, social, obstetrical and family history as well as problem list, medications and allergies. ? ?Objective  ? ?Vitals:  ? 02/24/22 1041  ?BP: 101/75  ?Pulse: 94  ?Weight: 154 lb (69.9 kg)  ?There is no height or weight on file to calculate BMI.  ?Total Weight Gain:32 lb (14.5 kg) ? ?  ?     Physical Examination:  ? General appearance: Well appearing, and in no distress ? Mental status: Alert, oriented to person, place, and time ? Skin: Warm & dry ? Cardiovascular: Normal heart rate noted ? Respiratory: Normal respiratory effort, no distress ? Abdomen: Soft, gravid, nontender, SGA with Fundal Height: 31 cm ? Pelvic: Cervical exam deferred        Presentation: Undeterminable by leopolds ? Extremities: Edema: Trace ? ?Fetal Status: Fetal Heart Rate (bpm): 131  Movement: Present  ? ?No results found for this or any previous visit (from the past 24 hour(s)).  ?Assessment & Plan:  ?Low-risk pregnancy of a 26 y.o., G1P0 at [redacted]w[redacted]d with an Estimated Date of  Delivery: 03/15/22  ? ?1. Encounter for supervision of normal first pregnancy in second trimester ?-Anticipatory guidance for upcoming appts. ?-Patient to schedule next appt in 1 weeks for an in-person visit. ?-Reviewed GBS negative result. ? ?2. [redacted] weeks gestation of pregnancy ?-Doing well. ?-Reports constipation improved with usage of squatty potty. ?-Encouraged intake of fiber and water in diet. ? ?3. Uterine size-date discrepancy in third trimester ?-FH at 31cm ?-[place] scheduled for today. ?-Informed that if infant found to be IUGR or malpresentation WB candidacy would be reversed.  ?-Patient verbalizes understanding and without questions.  ? ? ?  ?Meds: No orders of the defined types were placed in this encounter. ? ?Labs/procedures today:  ?Lab Orders  ?No laboratory test(s) ordered today  ?  ? ?Reviewed: Term labor symptoms and general obstetric precautions including but not limited to vaginal bleeding, contractions, leaking of fluid and fetal movement were reviewed in detail with the patient.  All questions were answered. ? ?Follow-up: Return in about 1 week (around 03/03/2022) for LROB. ? ?No orders of the defined types were placed in this encounter. ? ?Cherre Robins MSN, CNM ?02/24/2022 ? ?

## 2022-02-24 NOTE — Procedures (Signed)
Dawn Graves ?14-Jan-1996 ?[redacted]w[redacted]d ? ?Fetus A Non-Stress Test Interpretation for 02/24/22 ? ?Indication: Unsatisfactory BPP ? ?Fetal Heart Rate A ?Mode: External ?Baseline Rate (A): 135 bpm ?Variability: Moderate ?Accelerations: 15 x 15 ?Decelerations: None ?Multiple birth?: No ? ?Uterine Activity ?Mode: Palpation, Toco ?Contraction Frequency (min): Occas. ?Contraction Quality: Mild ?Resting Tone Palpated: Relaxed ?Resting Time: Adequate ? ?Interpretation (Fetal Testing) ?Nonstress Test Interpretation: Reactive ?Comments: Dr. Parke Poisson reviewed tracing. ? ? ?

## 2022-02-25 ENCOUNTER — Other Ambulatory Visit: Payer: Self-pay | Admitting: *Deleted

## 2022-02-25 DIAGNOSIS — O0933 Supervision of pregnancy with insufficient antenatal care, third trimester: Secondary | ICD-10-CM

## 2022-03-03 ENCOUNTER — Ambulatory Visit (INDEPENDENT_AMBULATORY_CARE_PROVIDER_SITE_OTHER): Payer: Medicaid Other

## 2022-03-03 ENCOUNTER — Other Ambulatory Visit: Payer: Self-pay

## 2022-03-03 VITALS — BP 104/78 | HR 84 | Wt 152.0 lb

## 2022-03-03 DIAGNOSIS — O4103X1 Oligohydramnios, third trimester, fetus 1: Secondary | ICD-10-CM

## 2022-03-03 DIAGNOSIS — Z3403 Encounter for supervision of normal first pregnancy, third trimester: Secondary | ICD-10-CM

## 2022-03-03 DIAGNOSIS — Z3A38 38 weeks gestation of pregnancy: Secondary | ICD-10-CM

## 2022-03-03 NOTE — Progress Notes (Signed)
? ?  LOW-RISK PREGNANCY OFFICE VISIT ? ?Patient name: Dawn Graves MRN [id number]  Date of birth: 06-19-1996 ?Chief Complaint:   ?Routine Prenatal Visit ? ?Subjective:   ?Dawn Graves is a 26 y.o. G1P0 female at [redacted]w[redacted]d with an Estimated Date of Delivery: 03/15/22 being seen today for ongoing management of a low-risk pregnancy aeb has Sports physical; Right hamstring muscle strain; Encounter for supervision of normal first pregnancy in second trimester; Late prenatal care affecting pregnancy in second trimester; and Low amniotic fluid, third trimester, fetus 1 on their problem list. ? ?Patient presents today, with her mother, and has concerns regarding her [place].  She questions if her fluid level is the reason she is measuring small.  She also questions WB eligibility.  Patient endorses fetal movement. Patient denies abdominal cramping or contractions.  Patient denies vaginal concerns including abnormal discharge, leaking of fluid, and bleeding. No issues with urination. Contractions: Not present. Vag. Bleeding: None.  Movement: Present. ? ?Reviewed past medical,surgical, social, obstetrical and family history as well as problem list, medications and allergies. ? ?Objective  ? ?Vitals:  ? 03/03/22 1124  ?BP: 104/78  ?Pulse: 84  ?Weight: 152 lb (68.9 kg)  ?There is no height or weight on file to calculate BMI.  ?Total Weight Gain:30 lb (13.6 kg) ? ?  ?     Physical Examination:  ? General appearance: Well appearing, and in no distress ? Mental status: Alert, oriented to person, place, and time ? Skin: Warm & dry ? Cardiovascular: Normal heart rate noted ? Respiratory: Normal respiratory effort, no distress ? Abdomen: Soft, gravid, nontender, SGA with Fundal Height: 34 cm ? Pelvic: Cervical exam deferred          ? Extremities: Edema: Trace ? ?Fetal Status: Fetal Heart Rate (bpm): 126  Movement: Present  ? ?No results found for this or any previous visit (from the past 24 hour(s)).  ?Assessment & Plan:  ?Low-risk  pregnancy of a 26 y.o., G1P0 at [redacted]w[redacted]d with an Estimated Date of Delivery: 03/15/22  ? ?1. Encounter for supervision of normal first pregnancy in third trimester ?-Anticipatory guidance for upcoming appts. ?-Patient to schedule next appt in 1 weeks for an in-person visit. ?-Discussed scheduling of IOL next week for 41 weeks. ?-Briefly reviewed and highly recommended outpatient foley bulb in case of induction as a means of initiation labor and making WB an option.  ? ?2. [redacted] weeks gestation of pregnancy ?-Doing well ?-Reassured that growth is appropriate and still currently a candidate for WB. ? ?3. Low amniotic fluid, third trimester, fetus 1 ?-AFI at 7th%ile on 3/14 ?-Recommendation for IOL if AFI </=5%ile or <2cm pocket. ?-Discussed recommendations and plan for same day induction if these findings are noted on her upcoming 3/23 exam. ?-informed that if spontaneous labor occurs before that time, she remains a candidate for WB. ?-Cautioned that oligohydramnios can contribute to NR fetal tracings.  ? ?  ?Meds: No orders of the defined types were placed in this encounter. ? ?Labs/procedures today:  ?Lab Orders  ?No laboratory test(s) ordered today  ?  ? ?Reviewed: Preterm labor symptoms and general obstetric precautions including but not limited to vaginal bleeding, contractions, leaking of fluid and fetal movement were reviewed in detail with the patient.  All questions were answered. ? ?Follow-up: Return in about 1 week (around 03/10/2022) for LROB. ? ?No orders of the defined types were placed in this encounter. ? ?Cherre Robins MSN, CNM ?03/03/2022 ? ?

## 2022-03-04 ENCOUNTER — Ambulatory Visit (HOSPITAL_BASED_OUTPATIENT_CLINIC_OR_DEPARTMENT_OTHER): Payer: Medicaid Other

## 2022-03-04 ENCOUNTER — Ambulatory Visit: Payer: Medicaid Other | Attending: Obstetrics | Admitting: *Deleted

## 2022-03-04 VITALS — BP 107/76 | HR 84

## 2022-03-04 DIAGNOSIS — O0933 Supervision of pregnancy with insufficient antenatal care, third trimester: Secondary | ICD-10-CM | POA: Insufficient documentation

## 2022-03-04 DIAGNOSIS — O26843 Uterine size-date discrepancy, third trimester: Secondary | ICD-10-CM

## 2022-03-04 DIAGNOSIS — O358XX0 Maternal care for other (suspected) fetal abnormality and damage, not applicable or unspecified: Secondary | ICD-10-CM

## 2022-03-04 DIAGNOSIS — Z3A38 38 weeks gestation of pregnancy: Secondary | ICD-10-CM | POA: Diagnosis not present

## 2022-03-04 DIAGNOSIS — O4103X1 Oligohydramnios, third trimester, fetus 1: Secondary | ICD-10-CM

## 2022-03-04 DIAGNOSIS — Z362 Encounter for other antenatal screening follow-up: Secondary | ICD-10-CM | POA: Insufficient documentation

## 2022-03-04 DIAGNOSIS — Z3402 Encounter for supervision of normal first pregnancy, second trimester: Secondary | ICD-10-CM

## 2022-03-05 ENCOUNTER — Ambulatory Visit: Payer: Medicaid Other

## 2022-03-05 ENCOUNTER — Other Ambulatory Visit: Payer: Medicaid Other

## 2022-03-10 ENCOUNTER — Encounter: Payer: Self-pay | Admitting: Advanced Practice Midwife

## 2022-03-10 ENCOUNTER — Ambulatory Visit (INDEPENDENT_AMBULATORY_CARE_PROVIDER_SITE_OTHER): Payer: Medicaid Other | Admitting: Advanced Practice Midwife

## 2022-03-10 ENCOUNTER — Other Ambulatory Visit: Payer: Self-pay

## 2022-03-10 VITALS — BP 120/80 | HR 66 | Wt 156.0 lb

## 2022-03-10 DIAGNOSIS — O4103X1 Oligohydramnios, third trimester, fetus 1: Secondary | ICD-10-CM

## 2022-03-10 DIAGNOSIS — Z3A39 39 weeks gestation of pregnancy: Secondary | ICD-10-CM

## 2022-03-10 NOTE — Progress Notes (Signed)
? ?  PRENATAL VISIT NOTE ? ?Subjective:  ?Dawn Graves is a 26 y.o. G1P0 at [redacted]w[redacted]d being seen today for ongoing prenatal care.  She is currently monitored for the following issues for this low-risk pregnancy and has Sports physical; Right hamstring muscle strain; Encounter for supervision of normal first pregnancy in second trimester; Late prenatal care affecting pregnancy in second trimester; and Low amniotic fluid, third trimester, fetus 1 on their problem list. ? ?Patient reports occasional contractions.  Contractions: Irregular. Vag. Bleeding: None.  Movement: Present. Denies leaking of fluid.  ? ?The following portions of the patient's history were reviewed and updated as appropriate: allergies, current medications, past family history, past medical history, past social history, past surgical history and problem list.  ? ?Objective:  ? ?Vitals:  ? 03/10/22 0916  ?BP: 120/80  ?Pulse: 66  ?Weight: 156 lb (70.8 kg)  ? ? ?Fetal Status: Fetal Heart Rate (bpm): 140   Movement: Present    ? ?General:  Alert, oriented and cooperative. Patient is in no acute distress.  ?Skin: Skin is warm and dry. No rash noted.   ?Cardiovascular: Normal heart rate noted  ?Respiratory: Normal respiratory effort, no problems with respiration noted  ?Abdomen: Soft, gravid, appropriate for gestational age.  Pain/Pressure: Present     ?Pelvic: Deferred       ?Extremities: Normal range of motion.  Edema: Trace  ?Mental Status: Normal mood and affect. Normal behavior. Normal judgment and thought content.  ? ?Assessment and Plan:  ?Pregnancy: G1P0 at [redacted]w[redacted]d ?1. [redacted] weeks gestation of pregnancy ?    Reviewed labor signs ?    Will play it by ear re: low amniotic fluid, fetal well being. Re: waterbirth ? ?2. Low amniotic fluid, third trimester, fetus 1 ?    Improved to 10.9 after 7.7 on 3/14   Has [place] scheduled tomorrow ? ?Term labor symptoms and general obstetric precautions including but not limited to vaginal bleeding, contractions, leaking of  fluid and fetal movement were reviewed in detail with the patient. ?Please refer to After Visit Summary for other counseling recommendations.  ? ?Return in about 1 week (around 03/17/2022) for [facility]. ? ?Future Appointments  ?Date Time Provider Department Center  ?03/11/2022  7:15 AM [facility] NURSE [facility]  ?03/11/2022  7:30 AM [facility] US2 [facility]  ?03/17/2022  9:35 AM Aviva Signs, CNM [facility] None  ? ? ?Wynelle Bourgeois, CNM ?

## 2022-03-11 ENCOUNTER — Ambulatory Visit: Payer: Medicaid Other | Admitting: *Deleted

## 2022-03-11 ENCOUNTER — Ambulatory Visit: Payer: Medicaid Other | Attending: Obstetrics

## 2022-03-11 ENCOUNTER — Encounter: Payer: Self-pay | Admitting: *Deleted

## 2022-03-11 VITALS — BP 120/84 | HR 70

## 2022-03-11 DIAGNOSIS — O26843 Uterine size-date discrepancy, third trimester: Secondary | ICD-10-CM

## 2022-03-11 DIAGNOSIS — O0933 Supervision of pregnancy with insufficient antenatal care, third trimester: Secondary | ICD-10-CM | POA: Diagnosis not present

## 2022-03-11 DIAGNOSIS — Z362 Encounter for other antenatal screening follow-up: Secondary | ICD-10-CM

## 2022-03-11 DIAGNOSIS — Z3A39 39 weeks gestation of pregnancy: Secondary | ICD-10-CM | POA: Diagnosis not present

## 2022-03-11 DIAGNOSIS — O26849 Uterine size-date discrepancy, unspecified trimester: Secondary | ICD-10-CM

## 2022-03-11 DIAGNOSIS — O358XX0 Maternal care for other (suspected) fetal abnormality and damage, not applicable or unspecified: Secondary | ICD-10-CM | POA: Diagnosis not present

## 2022-03-14 ENCOUNTER — Encounter (HOSPITAL_COMMUNITY): Payer: Self-pay | Admitting: Family Medicine

## 2022-03-14 ENCOUNTER — Inpatient Hospital Stay (HOSPITAL_COMMUNITY)
Admission: AD | Admit: 2022-03-14 | Discharge: 2022-03-14 | Disposition: A | Discharge status: Home / Self Care | Payer: Medicaid Other | Attending: Family Medicine | Admitting: Family Medicine

## 2022-03-14 ENCOUNTER — Other Ambulatory Visit: Payer: Self-pay

## 2022-03-14 DIAGNOSIS — O4103X1 Oligohydramnios, third trimester, fetus 1: Secondary | ICD-10-CM

## 2022-03-14 DIAGNOSIS — Z3689 Encounter for other specified antenatal screening: Secondary | ICD-10-CM

## 2022-03-14 DIAGNOSIS — O479 False labor, unspecified: Secondary | ICD-10-CM | POA: Diagnosis not present

## 2022-03-14 DIAGNOSIS — Z3402 Encounter for supervision of normal first pregnancy, second trimester: Secondary | ICD-10-CM

## 2022-03-14 DIAGNOSIS — Z3A39 39 weeks gestation of pregnancy: Secondary | ICD-10-CM | POA: Diagnosis not present

## 2022-03-14 DIAGNOSIS — O471 False labor at or after 37 completed weeks of gestation: Secondary | ICD-10-CM | POA: Insufficient documentation

## 2022-03-14 NOTE — MAU Provider Note (Addendum)
Per RN: Ms. Dawn Graves is a 26 y.o. G1P0 at [redacted]w[redacted]d  who presents to MAU today complaining of regular contractions minutes since midnight. No VB or LOF. +FM.  ? ?O: BP 107/77   Pulse 83   Temp 98 ?F (36.7 ?C) (Oral)   Resp 12   Ht 5\' 11"  (1.803 m)   Wt 70.3 kg   LMP 05/26/2021   SpO2 97%   BMI 21.62 kg/m?  ? ?Cervical exam:  ?Dilation: Fingertip ?Effacement (%): 70 ?Cervical Position: Posterior ?Exam by:: M. Zebedee Segundo,CNM ? ?Fetal Monitoring: ?Baseline: 130 ?Variability: mod ?Accelerations: + ?Decelerations: no ?Contractions: q3-7 ? ?MDM: no cervical change. Stable for discharge.  ? ?A: ?SIUP at [redacted]w[redacted]d  ?False labor ?Reactive NST ? ?P: ?Discharge home ?Follow up @[facility] as scheduled in 2 days ?Labor precautions ?FMCs ? ?[redacted]w[redacted]d, CNM ?03/14/2022 8:47 AM ? ?

## 2022-03-14 NOTE — MAU Note (Signed)
Pt is G1 at 39.6wks. Reports having ctxs for the last wk which were not painful. Having more uncomfortable ctxs since . Denies LOF. Some bloody show. Good FM. No recent sve ?

## 2022-03-17 ENCOUNTER — Telehealth: Payer: Self-pay

## 2022-03-17 ENCOUNTER — Ambulatory Visit (INDEPENDENT_AMBULATORY_CARE_PROVIDER_SITE_OTHER): Payer: Medicaid Other | Admitting: Advanced Practice Midwife

## 2022-03-17 VITALS — BP 111/80 | HR 82 | Wt 152.0 lb

## 2022-03-17 DIAGNOSIS — O4103X1 Oligohydramnios, third trimester, fetus 1: Secondary | ICD-10-CM

## 2022-03-17 DIAGNOSIS — Z3402 Encounter for supervision of normal first pregnancy, second trimester: Secondary | ICD-10-CM

## 2022-03-17 NOTE — Progress Notes (Signed)
? ?  PRENATAL VISIT NOTE ? ?Subjective:  ?Dawn Graves is a 26 y.o. G1P0 at [redacted]w[redacted]d being seen today for ongoing prenatal care.  She is currently monitored for the following issues for this low-risk pregnancy and has Sports physical; Right hamstring muscle strain; Encounter for supervision of normal first pregnancy in second trimester; Late prenatal care affecting pregnancy in second trimester; and Low amniotic fluid, third trimester, fetus 1 on their problem list. ? ?Patient reports occasional contractions.  Contractions: Irregular. Vag. Bleeding: Scant.  Movement: Present. Denies leaking of fluid.  ? ?The following portions of the patient's history were reviewed and updated as appropriate: allergies, current medications, past family history, past medical history, past social history, past surgical history and problem list.  ? ?Objective:  ? ?Vitals:  ? 03/17/22 0938  ?BP: 111/80  ?Pulse: 82  ?Weight: 152 lb (68.9 kg)  ? ? ?Fetal Status: Fetal Heart Rate (bpm): 140   Movement: Present    ? ?General:  Alert, oriented and cooperative. Patient is in no acute distress.  ?Skin: Skin is warm and dry. No rash noted.   ?Cardiovascular: Normal heart rate noted  ?Respiratory: Normal respiratory effort, no problems with respiration noted  ?Abdomen: Soft, gravid, appropriate for gestational age.  Pain/Pressure: Present     ?Pelvic: Cervical exam deferred        ?Extremities: Normal range of motion.  Edema: Trace  ?Mental Status: Normal mood and affect. Normal behavior. Normal judgment and thought content.  ? ?Assessment and Plan:  ?Pregnancy: G1P0 at [redacted]w[redacted]d ?1. Encounter for supervision of normal first pregnancy in second trimester ?    Labor precautions and postdates IOL discussed ? ?2. Low amniotic fluid, third trimester, fetus 1 ?   Improved last [place] to 8.7 (18%) ?   Will schedule IOL this week per recommendations ? ?Term labor symptoms and general obstetric precautions including but not limited to vaginal bleeding,  contractions, leaking of fluid and fetal movement were reviewed in detail with the patient. ?Please refer to After Visit Summary for other counseling recommendations.  ? ?No follow-ups on file. ? ?Future Appointments  ?Date Time Provider [place]  ?03/22/2022 12:00 AM [facility] SCHED ROOM [facility] None  ?04/28/2022 11:15 AM Seabron Spates, CNM [facility] None  ? ? ?Hansel Feinstein, CNM ?

## 2022-03-17 NOTE — Telephone Encounter (Signed)
Left message for patient to return call to office.  ?Wynelle Bourgeois CNM would like her induction date moved up due to MFM recommendations.  ? ?Patient will be induced on 03/18/2022 in the AM. Armandina Stammer RN  ?

## 2022-03-18 ENCOUNTER — Inpatient Hospital Stay (HOSPITAL_COMMUNITY): Payer: Medicaid Other

## 2022-03-18 ENCOUNTER — Inpatient Hospital Stay (HOSPITAL_COMMUNITY)
Admission: AD | Admit: 2022-03-18 | Discharge: 2022-03-21 | DRG: 805 | Disposition: A | Discharge status: Home / Self Care | Payer: Medicaid Other | Attending: Family Medicine | Admitting: Family Medicine

## 2022-03-18 ENCOUNTER — Encounter (HOSPITAL_COMMUNITY): Payer: Self-pay | Admitting: Family Medicine

## 2022-03-18 DIAGNOSIS — O411231 Chorioamnionitis, third trimester, fetus 1: Secondary | ICD-10-CM | POA: Diagnosis not present

## 2022-03-18 DIAGNOSIS — O411230 Chorioamnionitis, third trimester, not applicable or unspecified: Secondary | ICD-10-CM | POA: Diagnosis not present

## 2022-03-18 DIAGNOSIS — O480 Post-term pregnancy: Principal | ICD-10-CM | POA: Diagnosis present

## 2022-03-18 DIAGNOSIS — Z3A40 40 weeks gestation of pregnancy: Secondary | ICD-10-CM | POA: Diagnosis not present

## 2022-03-18 DIAGNOSIS — O4103X1 Oligohydramnios, third trimester, fetus 1: Secondary | ICD-10-CM | POA: Diagnosis present

## 2022-03-18 DIAGNOSIS — O0932 Supervision of pregnancy with insufficient antenatal care, second trimester: Secondary | ICD-10-CM

## 2022-03-18 LAB — CBC
HCT: 36 % (ref 36.0–46.0)
Hemoglobin: 12.5 g/dL (ref 12.0–15.0)
MCH: 32.6 pg (ref 26.0–34.0)
MCHC: 34.7 g/dL (ref 30.0–36.0)
MCV: 94 fL (ref 80.0–100.0)
Platelets: 215 10*3/uL (ref 150–400)
RBC: 3.83 MIL/uL — ABNORMAL LOW (ref 3.87–5.11)
RDW: 12.6 % (ref 11.5–15.5)
WBC: 13.2 10*3/uL — ABNORMAL HIGH (ref 4.0–10.5)
nRBC: 0 % (ref 0.0–0.2)

## 2022-03-18 LAB — TYPE AND SCREEN
ABO/RH(D): B POS
Antibody Screen: NEGATIVE

## 2022-03-18 MED ORDER — ONDANSETRON HCL 4 MG/2ML IJ SOLN: 4.0000 mg | Freq: Four times a day (QID) | INTRAMUSCULAR | Status: DC | PRN

## 2022-03-18 MED ORDER — OXYTOCIN BOLUS FROM INFUSION
333.0000 mL | Freq: Once | INTRAVENOUS | Status: AC
  Administered 2022-03-19: 333 mL via INTRAVENOUS

## 2022-03-18 MED ORDER — SOD CITRATE-CITRIC ACID 500-334 MG/5ML PO SOLN: 30.0000 mL | ORAL | Status: DC | PRN

## 2022-03-18 MED ORDER — MISOPROSTOL 50MCG HALF TABLET
50.0000 ug | ORAL_TABLET | ORAL | Status: DC | PRN
Start: 2022-03-18 — End: 2022-03-20
  Administered 2022-03-18 – 2022-03-19 (×2): 50 ug via BUCCAL
  Filled 2022-03-18 (×3): qty 1

## 2022-03-18 MED ORDER — ACETAMINOPHEN 325 MG PO TABS: 650.0000 mg | ORAL_TABLET | ORAL | Status: DC | PRN

## 2022-03-18 MED ORDER — LACTATED RINGERS IV SOLN
500.0000 mL | INTRAVENOUS | Status: DC | PRN
  Administered 2022-03-19: 1000 mL via INTRAVENOUS
  Administered 2022-03-20: 500 mL via INTRAVENOUS
  Filled 2022-03-18: qty 1000

## 2022-03-18 MED ORDER — TERBUTALINE SULFATE 1 MG/ML IJ SOLN
0.2500 mg | Freq: Once | INTRAMUSCULAR | Status: DC | PRN
  Filled 2022-03-18: qty 1

## 2022-03-18 MED ORDER — OXYTOCIN-SODIUM CHLORIDE 30-0.9 UT/500ML-% IV SOLN
2.5000 [IU]/h | INTRAVENOUS | Status: DC
  Administered 2022-03-19: 2.5 [IU]/h via INTRAVENOUS
  Filled 2022-03-18: qty 500

## 2022-03-18 MED ORDER — LACTATED RINGERS IV SOLN: INTRAVENOUS | Status: DC

## 2022-03-18 MED ORDER — LIDOCAINE HCL (PF) 1 % IJ SOLN
30.0000 mL | INTRAMUSCULAR | Status: DC | PRN
Start: 2022-03-18 — End: 2022-03-20
  Filled 2022-03-18: qty 30

## 2022-03-18 NOTE — Progress Notes (Signed)
Patient ID: Dawn Graves, female   DOB: [birth year], 26 y.o.   MRN: [id number] ? ?In to introduce myself to pt and family; she is s/p cytotec x 1 dose and is feeling some of her ctx ? ?BP 115/78, P 70 ?FHR 120s, +accels, no decels, Cat 1 ?Ctx q 2-5 mins ?Cx deferred ? ?IUP@40 .3wks ?Low nl AFI ?Cx unfavorable ? ?She is interested in eating some light food prior to having her next cx exam; may shower as well; will plan to place cervical foley and repeat cytotec dose when she is ready ? ?Arabella Merles CNM ?03/18/2022 ?7:45 PM ? ?

## 2022-03-18 NOTE — Progress Notes (Signed)
Patient ID: Dawn Graves, female   DOB: 04-01-1996, 26 y.o.   MRN: [id number] ? ?S/p cytotec x 1 dose; feeling ctx stronger now ? ?BP 125/94, 117/87 ?FHR 120-130s, +accels, occ variables ?Ctx 2-4 mins ?Cx behind head/3cm/80%/vtx -2 ? ?IUP@40 .3wks ?Low normal AFI ?Cx now favorable ? ?Will manage expectantly for now; offered Pitocin however her preference is to wait and see if ctx increase spontaneously; will reassess ? ?Myrtis Ser CNM ?03/18/2022 ? ?

## 2022-03-18 NOTE — H&P (Addendum)
OBSTETRIC ADMISSION HISTORY AND PHYSICAL ? ?Dawn Graves is a 26 y.o. female G1P0 with IUP at [redacted]w[redacted]d by 8wk [place] presenting for Labor at term. She reports +FMs, No LOF, no VB, no blurry vision, headaches or peripheral edema, and RUQ pain.  She plans on breast feeding. She is undecided on birth control now with plans for abstinence. ?She received her [facility] at [facility]  ? ?Dating: By Brigid Re [place] --->  Estimated Date of Delivery: 03/15/22 ? ?Sono:   ? ?@[redacted]w[redacted]d , CWD, normal anatomy, Cephalic presentation,  2981g, 39% EFW ? ? ?Prenatal History/Complications:  ?-Late prenatal care ?-Fetal Choroid plexus cyst  ?-LR NIPS ?-Low amniotic fluid in 3rd trimester ? ?Past Medical History: ?Past Medical History:  ?Diagnosis Date  ? Immunization, varicella   ? Lab confirmed immunity on 07/02/2016, results sent for scanning  ? Medical history non-contributory   ? ? ?Past Surgical History: ?Past Surgical History:  ?Procedure Laterality Date  ? NO PAST SURGERIES    ? ? ?Obstetrical History: ?OB History   ? ? Gravida  ?1  ? Para  ?   ? Term  ?   ? Preterm  ?   ? AB  ?   ? Living  ?   ?  ? ? SAB  ?   ? IAB  ?   ? Ectopic  ?   ? Multiple  ?   ? Live Births  ?   ?   ?  ?  ? ? ?Social History ?Social History  ? ?Socioeconomic History  ? Marital status: Single  ?  Spouse name: Not on file  ? Number of children: Not on file  ? Years of education: Not on file  ? Highest education level: High school graduate  ?Occupational History  ? Not on file  ?Tobacco Use  ? Smoking status: Never  ? Smokeless tobacco: Never  ?Vaping Use  ? Vaping Use: Former  ? Substances: THC  ?Substance and Sexual Activity  ? Alcohol use: Not Currently  ?  Comment: not while preg  ? Drug use: No  ? Sexual activity: Yes  ?Other Topics Concern  ? Not on file  ?Social History Narrative  ? Not on file  ? ?Social Determinants of Health  ? ?Financial Resource Strain: Not on file  ?Food Insecurity: Not on file  ?Transportation Needs: Not on file  ?Physical Activity: Not on file   ?Stress: Not on file  ?Social Connections: Not on file  ? ? ?Family History: ?Family History  ?Problem Relation Age of Onset  ? Hypertension Maternal Grandmother   ? Diabetes Maternal Grandmother   ? Hypertension Paternal Grandmother   ? Diabetes Paternal Grandmother   ? Sudden death Neg Hx   ? Heart attack Neg Hx   ? Cancer Neg Hx   ? ? ?Allergies: ?Allergies  ?Allergen Reactions  ? Strawberry Extract   ? ? ?Pt denies allergies to latex, iodine, or shellfish. ? ?Medications Prior to Admission  ?Medication Sig Dispense Refill Last Dose  ? Prenatal Vit-Fe Fumarate-FA (PRENATAL VITAMIN) 27-0.8 MG TABS Take 1 tablet by mouth daily at 6 (six) AM. 100 tablet 12   ? ? ? ?Review of Systems  ? ?All systems reviewed and negative except as stated in HPI ? ?Blood pressure 110/82, pulse 93, temperature 99.1 ?F (37.3 ?C), temperature source Oral, resp. rate 15, height 5\' 11"  (1.803 m), weight 68.9 kg, last menstrual period 05/26/2021. ?General appearance: alert, cooperative, and appears stated age ?Lungs: clear  to auscultation bilaterally ?Heart: regular rate and rhythm ?Abdomen: soft, non-tender; bowel sounds normal ?Extremities: Homans sign is negative, no sign of DVT ?Presentation: cephalic ?Fetal monitoringBaseline: 130 bpm, Variability: Good {> 6 bpm), and Accelerations: Reactive ?Uterine activity. Cramps every  ?  ? ? ?Prenatal labs: ?ABO, Rh: B/Positive/-- (10/11 1009) ?Antibody: Negative (10/11 1009) ?Rubella: 4.47 (10/11 1009) ?RPR: Non Reactive (01/03 0828)  ?HBsAg: Negative (10/11 1009)  ?HIV: Non Reactive (01/03 4315)  ?GBS: Negative/-- (03/07 1125)  ?1 hr Glucola: Pass ?Genetic screening:  Normal  ?Anatomy US: Normal ? ?Prenatal Transfer Tool  ?Maternal Diabetes: No ?Genetic Screening: Normal ?Maternal Ultrasounds/Referrals: Normal ?Fetal Ultrasounds or other Referrals:  None ?Maternal Substance Abuse:  No ?Significant Maternal Medications:  None ?Significant Maternal Lab Results: Group B Strep  negativev ? ?No results found for this or any previous visit (from the past 24 hour(s)). ? ?Patient Active Problem List  ? Diagnosis Date Noted  ? Labor and delivery indication for care or intervention 03/18/2022  ? Low amniotic fluid, third trimester, fetus 1 02/24/2022  ? Encounter for supervision of normal first pregnancy in second trimester 09/23/2021  ? Late prenatal care affecting pregnancy in second trimester 09/23/2021  ? Right hamstring muscle strain 04/11/2015  ? Sports physical 10/26/2011  ? ? ?Assessment/Plan:  ?[name] is a 26 y.o. G1P0 at [redacted]w[redacted]d here for SVD at term  ? ?#Labor:Patient is progressing well without induction. Check by her nurse Elane Fritz showed cervix is currently 1cm dilated. Will start her on Cytotec with plan to reassess in 3-4 hours. Will consider Balloon at the time.  ?#Pain: IV Fentanyl  ?#FWB: Cat 1 ?#ID:  GBS negative  ?#MOF: Breast feeding  ?#MOC: Undecided, plan abstinence ? ? ?Jerre Simon, MD  ?Center for [facility], [facility] ?03/18/2022, 1:37 PM ? ?Attestation of Supervision of Student:  I confirm that I have verified the information documented in the resident's note and that I have also personally reperformed the history, physical exam and all medical decision making activities.  I have verified that all services and findings are accurately documented in this student's note; and I agree with management and plan as outlined in the documentation. I have also made any necessary editorial changes. ? ?--Cat I tracing ?--Preemptive discussion: If Pitocin needed and patient continues to desire waterbirth, can trial d/c Pit and assess for spontaneous labor to facilitate eligibility for waterbirth ? ?Calvert Cantor, CNM ?Center for [employer], [facility] ?03/18/2022 4:07 PM ? ? ? ?

## 2022-03-19 ENCOUNTER — Inpatient Hospital Stay (HOSPITAL_COMMUNITY): Payer: Medicaid Other | Admitting: Anesthesiology

## 2022-03-19 DIAGNOSIS — Z3A40 40 weeks gestation of pregnancy: Secondary | ICD-10-CM | POA: Diagnosis not present

## 2022-03-19 DIAGNOSIS — O411230 Chorioamnionitis, third trimester, not applicable or unspecified: Secondary | ICD-10-CM | POA: Diagnosis not present

## 2022-03-19 DIAGNOSIS — O480 Post-term pregnancy: Secondary | ICD-10-CM | POA: Diagnosis not present

## 2022-03-19 LAB — RPR: RPR Ser Ql: NONREACTIVE

## 2022-03-19 MED ORDER — EPHEDRINE 5 MG/ML INJ
10.0000 mg | INTRAVENOUS | Status: DC | PRN
  Filled 2022-03-19: qty 2

## 2022-03-19 MED ORDER — LACTATED RINGERS IV SOLN
INTRAVENOUS | Status: DC | PRN
  Filled 2022-03-19: qty 1000

## 2022-03-19 MED ORDER — PHENYLEPHRINE 40 MCG/ML (10ML) SYRINGE FOR IV PUSH (FOR BLOOD PRESSURE SUPPORT)
80.0000 ug | PREFILLED_SYRINGE | INTRAVENOUS | Status: DC | PRN
Start: 2022-03-19 — End: 2022-03-20
  Administered 2022-03-20: 80 ug via INTRAVENOUS
  Filled 2022-03-19: qty 10

## 2022-03-19 MED ORDER — TRANEXAMIC ACID-NACL 1000-0.7 MG/100ML-% IV SOLN
INTRAVENOUS | Status: AC
  Administered 2022-03-19: 1000 mg
  Filled 2022-03-19: qty 100

## 2022-03-19 MED ORDER — AMPICILLIN SODIUM 2 G IJ SOLR
2.0000 g | Freq: Four times a day (QID) | INTRAMUSCULAR | Status: DC
  Administered 2022-03-19: 2 g via INTRAVENOUS
  Filled 2022-03-19 (×4): qty 2000

## 2022-03-19 MED ORDER — GENTAMICIN SULFATE 40 MG/ML IJ SOLN
5.0000 mg/kg | INTRAVENOUS | Status: DC
  Administered 2022-03-19: 340 mg via INTRAVENOUS
  Filled 2022-03-19: qty 8.5

## 2022-03-19 MED ORDER — LIDOCAINE-EPINEPHRINE (PF) 2 %-1:200000 IJ SOLN
INTRAMUSCULAR | Status: DC | PRN
  Administered 2022-03-19: 5 mL via EPIDURAL

## 2022-03-19 MED ORDER — FENTANYL CITRATE (PF) 100 MCG/2ML IJ SOLN
INTRAMUSCULAR | Status: AC
  Administered 2022-03-19: 100 ug via INTRAVENOUS
  Filled 2022-03-19: qty 2

## 2022-03-19 MED ORDER — DIPHENHYDRAMINE HCL 50 MG/ML IJ SOLN: 12.5000 mg | INTRAMUSCULAR | Status: DC | PRN

## 2022-03-19 MED ORDER — PHENYLEPHRINE 40 MCG/ML (10ML) SYRINGE FOR IV PUSH (FOR BLOOD PRESSURE SUPPORT)
80.0000 ug | PREFILLED_SYRINGE | INTRAVENOUS | Status: DC | PRN
Start: 2022-03-19 — End: 2022-03-20
  Filled 2022-03-19 (×3): qty 10

## 2022-03-19 MED ORDER — FENTANYL CITRATE (PF) 100 MCG/2ML IJ SOLN
100.0000 ug | INTRAMUSCULAR | Status: DC | PRN
  Administered 2022-03-19 (×4): 100 ug via INTRAVENOUS
  Filled 2022-03-19 (×4): qty 2

## 2022-03-19 MED ORDER — LACTATED RINGERS IV SOLN
500.0000 mL | Freq: Once | INTRAVENOUS | Status: AC
  Administered 2022-03-19: 500 mL via INTRAVENOUS

## 2022-03-19 MED ORDER — FENTANYL-BUPIVACAINE-NACL 0.5-0.125-0.9 MG/250ML-% EP SOLN
12.0000 mL/h | EPIDURAL | Status: DC | PRN
  Administered 2022-03-19: 12 mL/h via EPIDURAL
  Filled 2022-03-19 (×3): qty 250

## 2022-03-19 NOTE — Progress Notes (Signed)
Labor Progress Note ?AMIT CEASE is a 27 y.o. G1P0 at [redacted]w[redacted]d presented for IOL for postdates with low normal AFI ? ?S:  ?Patient resting comfortably. Denies any rectal pressure ? ?O:  ?BP 110/70   Pulse (!) 53   Temp 99 ?F (37.2 ?C) (Oral)   Resp 16   Ht 5\' 11"  (1.803 m)   Wt 68.9 kg   LMP 05/26/2021   SpO2 99%   BMI 21.19 kg/m?  ? ?Fetal Tracing: ? ?Baseline: 120 ?Variability: mdoerate ?Accels: 15x15 ?Decels: none ? ?Toco: 2-4 ? ? ?CVE: Dilation: 10 ?Dilation Complete Date: 03/19/22 ?Dilation Complete Time: 1052 ?Effacement (%): 100 ?Cervical Position: Posterior ?Station: Plus 2 ?Presentation: Vertex ?Exam by:: Dellia Cloud, CNM ? ? ?[name]: 26 y.o. G1P0 [redacted]w[redacted]d IOL postdates with low normal AFI ?#Labor: Progressing well. Will labor down ?#Pain: epidural ?#FWB: Cat 1 ?#GBS negative ? ?Wende Mott, CNM ?6:57 PM ? ?

## 2022-03-19 NOTE — Progress Notes (Signed)
Labor Progress Note ?Dawn Graves is a 26 y.o. G1P0 at [redacted]w[redacted]d presented for IOL for postdates ? ?S:  ?Patient comfortable with epidural.  ? ?O:  ?BP 118/78   Pulse 70   Temp 98.7 ?F (37.1 ?C) (Oral)   Resp 16   Ht 5\' 11"  (1.803 m)   Wt 68.9 kg   LMP 05/26/2021   BMI 21.19 kg/m?  ? ?Fetal Tracing: ? ?Baseline: 120 ?Variability: moderate ?Accels: 15x15 ?Decels: none ? ?Toco: 4-8 ? ? ?CVE: Dilation: 6 ?Effacement (%): 100 ?Cervical Position: Posterior ?Station: 0 ?Presentation: Vertex ?Exam by:: [email or web address], CNM ? ? ?[name]: 26 y.o. G1P0 [redacted]w[redacted]d IOL postdates ?#Labor: Progressing well. Discussed with patient AROM for further augmentation. Patient desires to wait 1 hour and do it then. Patient states she needs time to wrap her mind around everything that has happened so far ?#Pain: epidural ?#FWB: Cat 1 ?#GBS negative ? ?[redacted]w[redacted]d, CNM ?3:29 PM ? ?

## 2022-03-19 NOTE — Progress Notes (Signed)
Patient ID: Dawn Graves, female   DOB: [birth year], 26 y.o.   MRN: [id number] ? ?Breathing w ctx; rec'd a dose of Fentanyl and feels it has helped ? ?BPs 119/79, 128/89 ?FHR 120s, +accels, no decels, occ variables ?Ctx 2-5 mins, some coupling ?Cx post behind head/4cm/80 ? ?IUP@40 .4wks ?Low nl AFI ?Latent labor ? ?Discussion of options: doesn't want to get in the tub/shower at this point; will continue laboring expectantly ?Anticipate vag del ? ?Dawn Graves CNM ?03/19/2022 ?2:11 AM ? ?

## 2022-03-19 NOTE — Progress Notes (Addendum)
Labor Progress Note [name] is a 26 y.o. G1P0 at [redacted]w[redacted]d presented for IOL for postdates with low normal AFI ? ?S:  ?Patient feels like things are getting worse. Requests RN to perform cervical exam ? ?O:  ?BP 114/72   Pulse 63   Temp 98.7 ?F (37.1 ?C) (Oral)   Resp 16   Ht 5\' 11"  (1.803 m)   Wt 68.9 kg   LMP 05/26/2021   BMI 21.19 kg/m?  ? ?Fetal Tracing: ? ?Baseline: 125 ?Variability: moderate ?Accels: 15x15 ?Decels: 1 variable while patient laying flat for check ? ?Toco: 4-6 ? ? ?CVE: Dilation: 4.5 ?Effacement (%): 90 ?Cervical Position: Posterior ?Station: 0 ?Presentation: Vertex ?Exam by:: Ginger Carne, RN ? ? ?[name]: 26 y.o. G1P0 [redacted]w[redacted]d IOL postdates with low normal AFI ?#Labor: Cervix unchanged since 0200. Patient does not want to start pitocin at this time. Discussed using cytotec to attempt further augmentation since she had a good response yesterday. Patient agreeable to plan of care. ?#Pain: IV pain medication- discussed with patient that she has had 4 doses at this time and each dose works less than the dose before. Encouraged patient to consider long term pain management plan.  ?#FWB: Cat 1 ?#GBS negative ? ? ?Wende Mott, CNM ?12:52 PM ? ?

## 2022-03-19 NOTE — Progress Notes (Signed)
Pharmacy Antibiotic Note ? ?Dawn Graves is a 26 y.o. female admitted on 03/18/2022 for IOL due to postdate at [redacted]w[redacted]d.  Pharmacy has been consulted for Gentamicin dosing for Triple I. ? ?Plan: ?Gentamicin 5mg /kg (340mg ) IV q24h ?Will continue to follow ? ?Height: 5\' 11"  (180.3 cm) ?Weight: 68.9 kg (151 lb 14.4 oz) ?IBW/kg (Calculated) : 70.8 ? ?Temp (24hrs), Avg:98.9 ?F (37.2 ?C), Min:98.6 ?F (37 ?C), Max:99.3 ?F (37.4 ?C) ? ?Recent Labs  ?Lab 03/18/22 ?1338  ?WBC 13.2*  ?  ?CrCl cannot be calculated (No successful lab value found.).   ? ?Allergies  ?Allergen Reactions  ? Strawberry Extract Other (See Comments)  ?  unknown  ? ? ?Antimicrobials this admission: ?Ampicillin 2 gram IV q6h  4/6 >> ? ?Thank you for allowing pharmacy to be a part of this patient?s care. ? ? ?03/19/2022 10:29 PM ? ?

## 2022-03-19 NOTE — Progress Notes (Addendum)
Labor Progress Note ?Dawn Graves is a 26 y.o. G1P0 at [redacted]w[redacted]d presented for IOL for postdates with low normal AFI ? ?S:  ?Patient desires AROM ? ?O:  ?BP 117/79   Pulse (!) 52   Temp 98.7 ?F (37.1 ?C) (Oral)   Resp 16   Ht 5\' 11"  (1.803 m)   Wt 68.9 kg   LMP 05/26/2021   SpO2 99%   BMI 21.19 kg/m?  ? ?Fetal Tracing: ? ?Baseline: 125 ?Variability: moderate ?Accels: 15x15 ?Decels: none ? ?Toco: 3-5 ? ? ?CVE: Dilation: 6 ?Effacement (%): 100 ?Cervical Position: Posterior ?Station: 0 ?Presentation: Vertex ?Exam by:: Dellia Cloud, CNM ? ? ?[name]: 26 y.o. G1P0 [redacted]w[redacted]d IOL postdates with low normal AFI ?#Labor: Progressing well. Discussed with patient risks and benefits of AROM for augmentation of labor. Patient agreeable to plan of care. AROM with small amount of clear fluid. Patient and FHR tolerated procedure well.  ? ?#Pain: epidural ?#FWB: Cat 1 ?#GBS negative ? ? ?Wende Mott, CNM ?4:35 PM ? ?

## 2022-03-19 NOTE — Progress Notes (Signed)
Patient ID: Dawn Graves, female   DOB: 08/22/1996, 26 y.o.   MRN: [id number] ? ?Pt sitting on the birth ball, breathing w ctx and coping well with good support; desires to get in the tub; has had a couple of mild range BP elevations, at least 2 of which were taken w ctx ? ?BPs 120/92, 118/86 ?FHR 120s, +accels, no decels ?Ctx irreg 2-4 mins, some coupline ?Cx deferred ? ?IUP@40 .4wks ?Latent labor/early active labor ? ?RN filling tub so will plan on intermittent auscultation and saline lock when she gets in ?Expectant management for now s/p cytotec x 1 dose ?Continue to follow BPs ?Anticipate vag del ? ?Myrtis Ser CNM ?03/19/2022 ? ?

## 2022-03-19 NOTE — Anesthesia Preprocedure Evaluation (Signed)
Anesthesia Evaluation  Patient identified by MRN, date of birth, ID band Patient awake    Reviewed: Allergy & Precautions, NPO status , Patient's Chart, lab work & pertinent test results  Airway Mallampati: II  TM Distance: >3 FB Neck ROM: Full    Dental no notable dental hx.    Pulmonary neg pulmonary ROS,    Pulmonary exam normal breath sounds clear to auscultation       Cardiovascular negative cardio ROS Normal cardiovascular exam Rhythm:Regular Rate:Normal     Neuro/Psych negative neurological ROS  negative psych ROS   GI/Hepatic negative GI ROS, Neg liver ROS,   Endo/Other  negative endocrine ROS  Renal/GU negative Renal ROS  negative genitourinary   Musculoskeletal negative musculoskeletal ROS (+)   Abdominal   Peds  Hematology negative hematology ROS (+)   Anesthesia Other Findings IOL for term   Reproductive/Obstetrics (+) Pregnancy                             Anesthesia Physical Anesthesia Plan  ASA: 2  Anesthesia Plan: Epidural   Post-op Pain Management:    Induction:   PONV Risk Score and Plan: Treatment may vary due to age or medical condition  Airway Management Planned: Natural Airway  Additional Equipment:   Intra-op Plan:   Post-operative Plan:   Informed Consent: I have reviewed the patients History and Physical, chart, labs and discussed the procedure including the risks, benefits and alternatives for the proposed anesthesia with the patient or authorized representative who has indicated his/her understanding and acceptance.       Plan Discussed with: Anesthesiologist  Anesthesia Plan Comments: (Patient identified. Risks, benefits, options discussed with patient including but not limited to bleeding, infection, nerve damage, paralysis, failed block, incomplete pain control, headache, blood pressure changes, nausea, vomiting, reactions to medication,  itching, and post partum back pain. Confirmed with bedside nurse the patient's most recent platelet count. Confirmed with the patient that they are not taking any anticoagulation, have any bleeding history or any family history of bleeding disorders. Patient expressed understanding and wishes to proceed. All questions were answered. )        Anesthesia Quick Evaluation  

## 2022-03-19 NOTE — Lactation Note (Signed)
This note was copied from a baby's chart. ?Lactation Consultation Note ? ?Patient Name: Girl Joplyn Tatar ?Today's Date: 03/19/2022 ?Reason for consult: L&D Initial assessment;Mother's request;NICU baby;Breastfeeding assistance ?Age:24 hours ? ?LC called to L and D to assist with colostrum collection as infant transferred to NICU. Mom in midst of repair flat on her back, making it difficult to collect.  ?LC provided hand pump with instructions to doula present about pumping milk storage and collections bottles provided.  ?Mom to receive further support once on the floor.  ?[facility] brochure also provided.  ?Mom feeding plan to EBF and not offer formula.  ? ?Maternal Data ?Has patient been taught Hand Expression?: Yes ? ?Feeding ?Mother's Current Feeding Choice: Breast Milk ? ?LATCH Score ?  ? ?  ? ?  ? ?  ? ?  ? ?  ? ? ?Lactation Tools Discussed/Used ?Tools: Pump;Flanges ?Flange Size: 24 ?Breast pump type: Manual ?Reason for Pumping: increase stimulation ?Pumping frequency: every 3 hrs for 10 min each breast (Mom infant went to NICU for sepsis workup. Mom feeding plan to EBF. LC called to L and D to collect colostrum, mom in midst of repair laying flat on her back. Hard to collect colostrum. Hand pump provided, doula present to assist her.) ? ?Interventions ?Interventions: Breast feeding basics reviewed;Expressed milk;Hand pump;Education;Infant Driven Feeding Algorithm education ? ?Discharge ?  ? ?Consult Status ?Consult Status: Follow-up from L&D ?Date: 03/20/22 ?Follow-up type: In-patient ? ? ? ?Kinnley Paulson  Nicholson-Springer ?03/19/2022, 11:25 PM ? ? ? ?

## 2022-03-19 NOTE — Progress Notes (Addendum)
Labor Progress Note ?Dawn Graves is a 26 y.o. G1P0 at [redacted]w[redacted]d presented for IOL for postdates with low normal AFI ? ?S:  ?Patient in tub ? ?O:  ?BP 119/73   Pulse 64   Temp 98.7 ?F (37.1 ?C) (Oral)   Resp 16   Ht 5\' 11"  (1.803 m)   Wt 68.9 kg   LMP 05/26/2021   BMI 21.19 kg/m?  ? ?Patient Vitals for the past 24 hrs: ? BP Temp Temp src Pulse Resp Height Weight  ?03/19/22 1005 -- 98.7 ?F (37.1 ?C) Oral -- 16 -- --  ?03/19/22 0950 119/73 -- -- 64 -- -- --  ?03/19/22 0949 119/73 -- -- 64 -- -- --  ?03/19/22 0657 118/86 -- -- 62 16 -- --  ?03/19/22 0609 (!) 120/92 -- -- 63 16 -- --  ?03/19/22 0501 119/85 -- -- 69 16 -- --  ?03/19/22 0406 128/89 98.6 ?F (37 ?C) Oral 64 16 -- --  ?03/19/22 0312 126/76 -- -- (!) 57 16 -- --  ?03/19/22 0204 119/79 -- -- 67 18 -- --  ?03/19/22 0108 128/89 -- -- 73 16 -- --  ?03/19/22 0006 119/82 98.9 ?F (37.2 ?C) Oral 66 18 -- --  ?03/18/22 2303 (!) 142/90 -- -- 75 18 -- --  ?03/18/22 2225 124/82 -- -- 64 16 -- --  ?03/18/22 2101 (!) 125/94 -- -- 69 16 -- --  ?03/18/22 2003 117/87 98.2 ?F (36.8 ?C) Oral 68 16 -- --  ?03/18/22 1705 115/78 -- -- 70 17 -- --  ?03/18/22 1330 110/82 99.1 ?F (37.3 ?C) Oral 93 15 5\' 11"  (1.803 m) 68.9 kg  ? ? ? ?Doppler: 125 ? ?CVE: Dilation: 4.5 ?Effacement (%): 90 ?Cervical Position: Posterior ?Station: -1 ?Presentation: Vertex ?Exam by:: Ginger Carne, RN ? ? ?[name]: 26 y.o. G1P0 [redacted]w[redacted]d IOL postdates with low normal AFI ?#Labor: Patient coping well for now, Considering getting out of the tub. Will watch BPs closely and consider labs if they persist. Patient will have to d/c tub use if persistently elevated ?#Pain: hydrotherapy ?#FWB: Cat 1 ?#GBS negative ? ?Wende Mott, CNM ?10:12 AM ? ?

## 2022-03-19 NOTE — Discharge Summary (Addendum)
? ?  Postpartum Discharge Summary ? ?   ?Patient Name: Dawn Graves ?DOB: [birth year] ?MRN: [id number] ? ?Date of admission: 03/18/2022 ?Delivery date:03/19/2022  ?Delivering provider: Christin Fudge  ?Date of discharge: 03/21/2022 ? ?Admitting diagnosis: Labor and delivery indication for care or intervention [O75.9] ?Intrauterine pregnancy: [redacted]w[redacted]d    ?Secondary diagnosis:  Principal Problem: ?  Labor and delivery indication for care or intervention ?Active Problems: ?  Late prenatal care affecting pregnancy in second trimester ?  Low amniotic fluid, third trimester, fetus 1 ?  Chorioamnionitis in third trimester, fetus 1 ?  Vacuum extractor delivery, delivered ? ?Additional problems: Triple I    ?Discharge diagnosis: Term Pregnancy Delivered                                              ?Post partum procedures: None ?Augmentation: AROM and Cytotec ?Complications: Intrauterine Inflammation or infection (Chorioamniotis) ? ?Hospital course: Induction of Labor With Vaginal Delivery   ?26y.o. yo G1P0 at 469w4das admitted to the hospital 03/18/2022 for induction of labor.  Indication for induction: Postdates.  Patient had a labor course complicated by triple I/ suspected chorioamnionitis, fetal heart rate decelerations.  Received ampicillin and gentamicin in labor. ?Membrane Rupture Time/Date: 4:32 PM ,03/19/2022   ?Delivery Method:Vaginal, Vacuum (Extractor)  ?Episiotomy: None  ?Lacerations:  2nd degree  ?Details of delivery can be found in separate delivery note.  Baby was admitted to NICU for sepsis work-up.  DC on day of life 2.  Patient's postpartum course was complicated by fever of 10983.38ours postpartum followed by resolution of fevers without further antibiotics.  Denies abdominal pain, uterine tenderness.  Patient is discharged home 03/21/22. ? ?Newborn Data: ?Birth date:03/19/2022  ?Birth time:10:46 PM  ?Gender:Female  ?Living status:Living  ?Apgars:8 ,9  ?Weight:2890 g  ? ?Magnesium Sulfate received:  No ?BMZ received: No ?Rhophylac:N/A ?MMR:N/A ?T-DaP: ?Flu: N/A ?Transfusion:No ? ?Physical exam  ?Vitals:  ? 03/20/22 1120 03/20/22 1530 03/20/22 1950 03/21/22 0501  ?BP: (!) 89/60 117/85 97/74 107/67  ?Pulse: 63 72 76 60  ?Resp: 14 16 16 17   ?Temp: 98 ?F (36.7 ?C) 97.9 ?F (36.6 ?C) 98.8 ?F (37.1 ?C) 97.8 ?F (36.6 ?C)  ?TempSrc: Oral Oral Oral Oral  ?SpO2: 100% 100% 99% 99%  ?Weight:      ?Height:      ? ?General: alert, cooperative, and no distress ?Lochia: appropriate, normal odor ?Uterine Fundus: firm, nontender ?Incision: N/A ?DVT Evaluation: No cords or calf tenderness. ?No significant calf/ankle edema. ?Labs: ?Lab Results  ?Component Value Date  ? WBC 13.2 (H) 03/18/2022  ? HGB 12.5 03/18/2022  ? HCT 36.0 03/18/2022  ? MCV 94.0 03/18/2022  ? PLT 215 03/18/2022  ? ?   ? View : No data to display.  ?  ?  ?  ? ?Edinburgh Score: ? ?  03/20/2022  ? 11:20 AM  ?EdFlavia Shipperostnatal Depression Scale Screening Tool  ?I have been able to laugh and see the funny side of things. 2  ?I have looked forward with enjoyment to things. 1  ?I have blamed myself unnecessarily when things went wrong. 2  ?I have been anxious or worried for no good reason. 1  ?I have felt scared or panicky for no good reason. 1  ?Things have been getting on top of me. 2  ?I have been so unhappy that I  have had difficulty sleeping. 0  ?I have felt sad or miserable. 1  ?I have been so unhappy that I have been crying. 0  ?The thought of harming myself has occurred to me. 0  ?Edinburgh Postnatal Depression Scale Total 10  ? ? ? ?After visit meds:  ?Allergies as of 03/21/2022   ? ?   Reactions  ? Strawberry Extract Other (See Comments)  ? unknown  ? ?  ? ?  ?Medication List  ?  ? ?TAKE these medications   ? ?acetaminophen 500 MG tablet ?Commonly known as: TYLENOL ?Take 2 tablets (1,000 mg total) by mouth every 8 (eight) hours as needed (pain). ?  ?ibuprofen 600 MG tablet ?Commonly known as: ADVIL ?Take 1 tablet (600 mg total) by mouth every 6 (six) hours as  needed. ?  ?multivitamin-prenatal 27-0.8 MG Tabs tablet ?Take 1 tablet by mouth daily at 6 (six) AM. ?  ?polyethylene glycol 17 g packet ?Commonly known as: MIRALAX / GLYCOLAX ?Take 17 g by mouth daily. ?  ? ?  ? ? ? ?Discharge home in stable condition ?Infant Feeding: Bottle ?Infant Disposition:home with mother ?Discharge instruction: per After Visit Summary and Postpartum booklet. ?Activity: Advance as tolerated. Pelvic rest for 6 weeks.  ?Diet: routine diet ?Future Appointments: ?Future Appointments  ?Date Time Provider Garrett  ?04/28/2022 11:15 AM Seabron Spates, CNM [facility] None  ? ?Follow up Visit: ? Follow-up Information   ? ? [facility] Follow up in 4 week(s).   ?Specialty: Obstetrics and Gynecology ?Why: For postpartum visit or sooner as needed for complications ?Contact information: ?[address] ?[address] ?[place] [address] ?[phone] ? ?  ?  ? ? [facility] Follow up.   ?Specialty: Obstetrics and Gynecology ?Why: As needed in emergencies ?Contact information: ?[address] ?[id number] mc ?[place] ?[phone] ? ?  ?  ? ?  ?  ? ?  ? ? ? ?Please schedule this patient for a In person postpartum visit in 4 weeks with the following provider: Any provider. ?Additional Postpartum F/U: Ambulatory referral for integrated behavioral health based on elevated [place] 10. ?Low risk pregnancy complicated by:  ?Delivery mode:  Vaginal, Vacuum (Extractor)  ?Anticipated Birth Control:  Unsure, would like to discuss further at postpartum visit ? ? ?03/21/2022 ?Daniel Nones, MD ?[facility] PGY-1 ? ?I was present for the exam and agree with above. ? ?Tamala Julian, [place], [place] ?03/21/2022 ?2:58 PM  ? ? ?

## 2022-03-19 NOTE — Anesthesia Procedure Notes (Signed)
Epidural ?Patient location during procedure: OB ?Start time: 03/19/2022 2:50 PM ?End time: 03/19/2022 3:00 PM ? ?Staffing ?Anesthesiologist: Elmer Picker, MD ?Performed: anesthesiologist  ? ?Preanesthetic Checklist ?Completed: patient identified, IV checked, risks and benefits discussed, monitors and equipment checked, pre-op evaluation and timeout performed ? ?Epidural ?Patient position: sitting ?Prep: DuraPrep and site prepped and draped ?Patient monitoring: continuous pulse ox, blood pressure, heart rate and cardiac monitor ?Approach: midline ?Location: L3-L4 ?Injection technique: LOR air ? ?Needle:  ?Needle type: Tuohy  ?Needle gauge: 17 G ?Needle length: 9 cm ?Needle insertion depth: 4 cm ?Catheter type: closed end flexible ?Catheter size: 19 Gauge ?Catheter at skin depth: 9 cm ?Test dose: negative ? ?Assessment ?Sensory level: T8 ?Events: blood not aspirated, injection not painful, no injection resistance, no paresthesia and negative IV test ? ?Additional Notes ?Patient identified. Risks/Benefits/Options discussed with patient including but not limited to bleeding, infection, nerve damage, paralysis, failed block, incomplete pain control, headache, blood pressure changes, nausea, vomiting, reactions to medication both or allergic, itching and postpartum back pain. Confirmed with bedside nurse the patient's most recent platelet count. Confirmed with patient that they are not currently taking any anticoagulation, have any bleeding history or any family history of bleeding disorders. Patient expressed understanding and wished to proceed. All questions were answered. Sterile technique was used throughout the entire procedure. Please see nursing notes for vital signs. Test dose was given through epidural catheter and negative prior to continuing to dose epidural or start infusion. Warning signs of high block given to the patient including shortness of breath, tingling/numbness in hands, complete motor block, or  any concerning symptoms with instructions to call for help. Patient was given instructions on fall risk and not to get out of bed. All questions and concerns addressed with instructions to call with any issues or inadequate analgesia.  Reason for block:procedure for pain ? ? ? ?

## 2022-03-20 ENCOUNTER — Encounter (HOSPITAL_COMMUNITY): Payer: Self-pay | Admitting: Family Medicine

## 2022-03-20 MED ORDER — BENZOCAINE-MENTHOL 20-0.5 % EX AERO
1.0000 "application " | INHALATION_SPRAY | CUTANEOUS | Status: DC | PRN
  Administered 2022-03-20: 1 via TOPICAL
  Filled 2022-03-20: qty 56

## 2022-03-20 MED ORDER — DIPHENHYDRAMINE HCL 25 MG PO CAPS: 25.0000 mg | ORAL_CAPSULE | Freq: Four times a day (QID) | ORAL | Status: DC | PRN

## 2022-03-20 MED ORDER — PRENATAL MULTIVITAMIN CH
1.0000 | ORAL_TABLET | Freq: Every day | ORAL | Status: DC
  Administered 2022-03-20 – 2022-03-21 (×2): 1 via ORAL
  Filled 2022-03-20 (×2): qty 1

## 2022-03-20 MED ORDER — DIBUCAINE (PERIANAL) 1 % EX OINT
1.0000 | TOPICAL_OINTMENT | CUTANEOUS | Status: DC | PRN
Start: 2022-03-20 — End: 2022-03-21

## 2022-03-20 MED ORDER — TETANUS-DIPHTH-ACELL PERTUSSIS 5-2.5-18.5 LF-MCG/0.5 IM SUSY: 0.5000 mL | PREFILLED_SYRINGE | Freq: Once | INTRAMUSCULAR | Status: DC

## 2022-03-20 MED ORDER — ZOLPIDEM TARTRATE 5 MG PO TABS: 5.0000 mg | ORAL_TABLET | Freq: Every evening | ORAL | Status: DC | PRN

## 2022-03-20 MED ORDER — ONDANSETRON HCL 4 MG PO TABS: 4.0000 mg | ORAL_TABLET | ORAL | Status: DC | PRN

## 2022-03-20 MED ORDER — ACETAMINOPHEN 325 MG PO TABS
650.0000 mg | ORAL_TABLET | ORAL | Status: DC | PRN
  Administered 2022-03-20: 650 mg via ORAL
  Filled 2022-03-20: qty 2

## 2022-03-20 MED ORDER — ONDANSETRON HCL 4 MG/2ML IJ SOLN: 4.0000 mg | INTRAMUSCULAR | Status: DC | PRN

## 2022-03-20 MED ORDER — WITCH HAZEL-GLYCERIN EX PADS: 1.0000 "application " | MEDICATED_PAD | CUTANEOUS | Status: DC | PRN

## 2022-03-20 MED ORDER — SIMETHICONE 80 MG PO CHEW: 80.0000 mg | CHEWABLE_TABLET | ORAL | Status: DC | PRN

## 2022-03-20 MED ORDER — IBUPROFEN 600 MG PO TABS
600.0000 mg | ORAL_TABLET | Freq: Four times a day (QID) | ORAL | Status: DC
  Administered 2022-03-20 – 2022-03-21 (×5): 600 mg via ORAL
  Filled 2022-03-20 (×5): qty 1

## 2022-03-20 MED ORDER — COCONUT OIL OIL: 1.0000 "application " | TOPICAL_OIL | Status: DC | PRN

## 2022-03-20 MED ORDER — SENNOSIDES-DOCUSATE SODIUM 8.6-50 MG PO TABS
2.0000 | ORAL_TABLET | ORAL | Status: DC
  Administered 2022-03-21: 2 via ORAL
  Filled 2022-03-20 (×2): qty 2

## 2022-03-20 NOTE — Lactation Note (Signed)
This note was copied from a baby's chart. ? ?NICU Lactation Consultation Note ? ?Patient Name: Dawn Graves ?Today's Date: 03/20/2022 ?Age:26 years ? ? ?Subjective ?Reason for consult: Initial assessment ?Mother and baby are bf'ing today. Mother has not yet pumped. I provided education and offered to return to observe baby at breast. ? ?Objective ?Infant data: ?Mother's Current Feeding Choice: Breast Milk ? ?Infant feeding assessment ?Scale for Readiness: 2 ? ? ?  ?Maternal data: ?G1P1001  ?Vaginal, Vacuum (Extractor) ?Significant Breast History:: + breast changes in pregnancy ? ?Does the patient have breastfeeding experience prior to this delivery?: No ? ?Pumping frequency: mother has not pumped yet ?Flange Size: 24 ? ?Pump: DEBP, Personal (Medela) ? ?Assessment ?Infant: ?LATCH Score: 7 ? ?Feeding Status: Ad lib ? ? ?Maternal: ? ? ?Intervention/Plan ?Interventions: "The NICU and Your Baby" book; [facility] brochure; Education; Position options ? ?Tools: Pump ?Pump Education: Setup, frequency, and cleaning; Milk Storage (by RN) ? ?Plan: ?Consult Status: Follow-up ? ?NICU Follow-up type: Verify onset of copious milk; Maternal D/C visit; New admission follow up; Verify absence of engorgement ? ? ?Baby to bf ad lib. ?Mom to request LC return visit today to observe latch ?Mom to consider pumping if baby is not bf'ing regularly today ? ?Elder Negus ?03/20/2022, 1:25 PM ?

## 2022-03-20 NOTE — Lactation Note (Signed)
This note was copied from a baby's chart. ?Lactation Consultation Note ?Mother was sleeping soundly when [facility] presented to room. RN to notify [facility] when mother is awake and ready for [place] visit.  ? ?Patient Name: Dawn Graves ?Today's Date: 03/20/2022 ?  ?Age:24 hours ? ? ?Gwynne Edinger ?03/20/2022, 9:57 AM ? ? ? ?

## 2022-03-20 NOTE — Progress Notes (Addendum)
POSTPARTUM PROGRESS NOTE ? ?Post Partum Day 1 ? ?Subjective: ? ?Dawn Graves is a 26 y.o. G1P1001 s/p VAVD at [redacted]w[redacted]d.  She reports she is doing well. No acute events overnight. She denies any problems with ambulating, voiding or po intake. Denies nausea or vomiting.  Pain is moderately controlled, she is feeling some pain in her back, RN to give ibuprofen.  Lochia is appropriate. ? ?Objective: ?Blood pressure 92/62, pulse 85, temperature (!) 100.7 ?F (38.2 ?C), temperature source Oral, resp. rate 16, height 5\' 11"  (1.803 m), weight 68.9 kg, last menstrual period 05/26/2021, SpO2 99 %, unknown if currently breastfeeding. ? ?Physical Exam:  ?General: alert, cooperative and no distress ?Chest: no respiratory distress ?Heart:regular rate, distal pulses intact ?Uterine Fundus: firm, appropriately tender ?DVT Evaluation: No calf swelling or tenderness ?Extremities: no lower extremity edema ?Skin: warm, dry ? ?Recent Labs  ?  03/18/22 ?1338  ?HGB 12.5  ?HCT 36.0  ? ? ?Assessment/Plan: ?Dawn Graves is a 26 y.o. G1P1001 s/p VAVD at [redacted]w[redacted]d  ? ?PPD#1 - Doing well ? Routine postpartum care ? ?Contraception: Undecided ?Feeding: breast ?Dispo: Plan for discharge tomorrow. ? ?#Intrapartum Triple I: Diagnosed just before delivery based on fetal tachycardia/odor. Ampicillin started prior to deliver, gentamicin was not available in time to administer intrapartum. No fever intrapartum but temp of 100.7 noted at 0311 on 4/7.  ? - Monitor postpartum temp/symptoms. ? ? LOS: 2 days  ? ?6/7, MD ?[facility] PGY-1 ? ?03/20/2022, 6:06 AM ? ?Attestation: ? ?I confirm that I have verified the information documented in the resident?s note and that I have also personally reperformed the physical exam and all medical decision making activities.  ? ?Patient was seen and examined by me also ?Agree with note ?Vitals stable ?Labs stable ?Fundus firm, lochia within normal limits ?Perineum healing ?Ext WNL ?Continue care ?Anticipate discharge  tomorrow ? ? ?05/20/2022, CNM ? ?

## 2022-03-20 NOTE — Anesthesia Postprocedure Evaluation (Signed)
Anesthesia Post Note ? ?Patient: AFTYN NOTT ? ?Procedure(s) Performed: AN AD HOC LABOR EPIDURAL ? ?  ? ?Patient location during evaluation: Mother Baby ?Anesthesia Type: Epidural ?Level of consciousness: awake and alert ?Pain management: pain level controlled ?Vital Signs Assessment: post-procedure vital signs reviewed and stable ?Respiratory status: spontaneous breathing, nonlabored ventilation and respiratory function stable ?Cardiovascular status: stable ?Postop Assessment: no headache, no backache, epidural receding, no apparent nausea or vomiting, patient able to bend at knees, adequate PO intake and able to ambulate ?Anesthetic complications: no ? ? ?No notable events documented. ? ?Last Vitals:  ?Vitals:  ? 03/20/22 0311 03/20/22 0626  ?BP: 92/62 95/66  ?Pulse: 85 72  ?Resp:    ?Temp: (!) 38.2 ?C 37.1 ?C  ?SpO2: 99% 98%  ?  ?Last Pain:  ?Vitals:  ? 03/20/22 0820  ?TempSrc:   ?PainSc: 0-No pain  ? ?Pain Goal:   ? ?  ?  ?  ?  ?  ?  ?  ? ?Lenna Hagarty Hristova ? ? ? ? ?

## 2022-03-20 NOTE — Lactation Note (Signed)
This note was copied from a baby's chart. ?Lactation Consultation Note ?Returned at request of NP to assist with bf'ing. Observed independent latch with rhythmic suckling. We reviewed possible need for supplementation because of elevated bili and MD order. Mom has concern of nipple confusion. We discussed. I provided pumping ed.  ? ? ?Patient Name: Dawn Graves ?Today's Date: 03/20/2022 ?  ?Age:24 hours ?  ? ? ?Dawn Graves ?03/20/2022, 7:01 PM ? ? ? ?

## 2022-03-20 NOTE — Progress Notes (Signed)
CSW received consult due to score 10 on Edinburgh Depression Screen.   ? ?CSW met with MOB in couplet care in room 326.  When CSW arrived, MOB was bonding with infant as evidence by engaging in breastfeeding and skin to skin.  MOB mother was also present and engaged with CSW.  CSW explained CSW's role and MOB gave CSW permission to complete assessment while MOB's mother was present.  MOB presented as flat and tired however she engaged with CSW and was receptive to PMAD education.  ? ?CSW reviewed MOB's [place] results and asked about MOB's supports post discharge. MOB reported that FOB is not involved however MOB's immediate and extended family are and will provide supports when needed.   ? ?CSW provided education regarding Baby Blues vs PMADs and provided MOB with resources for mental health follow up.  CSW encouraged MOB to evaluate her mental health throughout the postpartum period with the use of the New Mom Checklist developed by [facility]partum [employer] as well as the [place] Postnatal Depression Scale and notify a medical professional if symptoms arise.  MOB presented with insight and awareness and did not demonstrate any acute MH symptoms. When CSW assessed for safety, MOB denied SI, HI, and DV.  MOB reported having all essential items for infant and communicated feeling prepared for infant's future discharge.  ? ?CSW will continue to offer resources and supports to family while infant remains in NICU.  ?  ?Laurey Arrow, MSW, LCSW ?Clinical Social Work ?[phone] ? ?

## 2022-03-21 ENCOUNTER — Other Ambulatory Visit (HOSPITAL_BASED_OUTPATIENT_CLINIC_OR_DEPARTMENT_OTHER): Payer: Self-pay

## 2022-03-21 ENCOUNTER — Other Ambulatory Visit: Payer: Self-pay | Admitting: Advanced Practice Midwife

## 2022-03-21 DIAGNOSIS — Z1331 Encounter for screening for depression: Secondary | ICD-10-CM

## 2022-03-21 DIAGNOSIS — O411231 Chorioamnionitis, third trimester, fetus 1: Secondary | ICD-10-CM | POA: Diagnosis not present

## 2022-03-21 MED ORDER — POLYETHYLENE GLYCOL 3350 17 GM/SCOOP PO POWD
17.0000 g | Freq: Every day | ORAL | 0 refills | Status: DC
  Filled 2022-03-21: qty 510, 30d supply, fill #0

## 2022-03-21 MED ORDER — IBUPROFEN 600 MG PO TABS
600.0000 mg | ORAL_TABLET | Freq: Four times a day (QID) | ORAL | 0 refills | Status: DC | PRN
  Filled 2022-03-21: qty 40, 10d supply, fill #0

## 2022-03-21 MED ORDER — ACETAMINOPHEN 500 MG PO TABS
1000.0000 mg | ORAL_TABLET | Freq: Three times a day (TID) | ORAL | 0 refills | Status: DC | PRN
  Filled 2022-03-21: qty 100, 17d supply, fill #0

## 2022-03-21 MED ORDER — POLYETHYLENE GLYCOL 3350 17 G PO PACK
17.0000 g | PACK | Freq: Every day | ORAL | Status: DC
  Administered 2022-03-21: 17 g via ORAL
  Filled 2022-03-21: qty 1

## 2022-03-21 NOTE — Lactation Note (Signed)
This note was copied from a baby's chart. ?Lactation Consultation Note ? ?Patient Name: Dawn Graves ?Today's Date: 03/21/2022 ?Reason for consult: Follow-up assessment;NICU baby;Term;Maternal discharge;1st time breastfeeding;Primapara ?Age:24 hours ? ?Visited with mom of 37 hours old FT NICU female, she's a P1 and reports the early onset of lactogenesis II. Mom and baby are going home today. Reviewed discharge education, feeding cues and the importance of pumping after feedings at the breast to protect her supply. NP Edmon Crape has already sent an order for ambulatory referral for lactation, this LC sent a referral to Renee Rival. for [facility] OP Ms. Bostick would like to continue working on breastfeeding post-NICU discharge.  ? ?Ms. Brockwell had a question regarding those "silver cups" that she saw online, provided a pack of breast shells instead in the case her nipples were to become sore and needed some air drying. Family members present and supportive. All questions and concerns answered, family to contact [facility] services PRN. ? ?Maternal Data ? Mom's supply is WNL, instructed her to switch from initiation to expression mode on her Medela pump ? ?Feeding ?Mother's Current Feeding Choice: Breast Milk ? ?Lactation Tools Discussed/Used ?Tools: Pump;Flanges;Coconut oil ?Flange Size: 24 ?Breast pump type: Double-Electric Breast Pump ?Pump Education: Setup, frequency, and cleaning;Milk Storage ?Reason for Pumping: NICU stay ?Pumping frequency: 8 times/24 hours ?Pumped volume: 26 mL ? ?Interventions ?Interventions: Coconut oil;DEBP;Education ? ?Discharge ?Discharge Education: Engorgement and breast care;Outpatient recommendation;Outpatient Epic message sent ?Pump: DEBP;Personal (Medela MaxFlow) ? ?Consult Status ?Consult Status: Complete ?Date: 03/21/22 ?Follow-up type: Call as needed ? ? ?Kylan Liberati S Deosha Werden ?03/21/2022, 12:08 PM ? ? ? ?

## 2022-03-22 ENCOUNTER — Inpatient Hospital Stay (HOSPITAL_COMMUNITY)
Admission: AD | Admit: 2022-03-22 | Payer: Medicaid Other | Source: Home / Self Care | Admitting: Obstetrics and Gynecology

## 2022-03-22 ENCOUNTER — Inpatient Hospital Stay (HOSPITAL_COMMUNITY): Payer: Medicaid Other

## 2022-03-23 ENCOUNTER — Telehealth: Payer: Self-pay

## 2022-03-23 ENCOUNTER — Other Ambulatory Visit (HOSPITAL_BASED_OUTPATIENT_CLINIC_OR_DEPARTMENT_OTHER): Payer: Self-pay

## 2022-03-23 NOTE — Telephone Encounter (Signed)
Transition Care Management Follow-up Telephone Call ?Date of discharge and from where: 03/21/2022-[facility]  ?How have you been since you were released from the hospital? Pt stated she is doing good.  ?Any questions or concerns? No ? ?Items Reviewed: ?Did the pt receive and understand the discharge instructions provided? Yes  ?Medications obtained and verified? Yes  ?Other? No  ?Any new allergies since your discharge? No  ?Dietary orders reviewed? No ?Do you have support at home? Yes  ? ?Home Care and Equipment/Supplies: ?Were home health services ordered? not applicable ?If so, what is the name of the agency? N/A  ?Has the agency set up a time to come to the patient's home? not applicable ?Were any new equipment or medical supplies ordered?  No ?What is the name of the medical supply agency? N/A ?Were you able to get the supplies/equipment? not applicable ?Do you have any questions related to the use of the equipment or supplies? No ? ?Functional Questionnaire: (I = Independent and D = Dependent) ?ADLs: I ? ?Bathing/Dressing- I ? ?Meal Prep- I ? ?Eating- I ? ?Maintaining continence- I ? ?Transferring/Ambulation- I ? ?Managing Meds- I ? ?Follow up appointments reviewed: ? ?PCP Hospital f/u appt confirmed? No   ?Specialist Hospital f/u appt confirmed? Yes  Scheduled to see OBGYN on 04/28/2022.  ?Are transportation arrangements needed? No  ?If their condition worsens, is the pt aware to call PCP or go to the Emergency Dept.? Yes ?Was the patient provided with contact information for the PCP's office or ED? Yes ?Was to pt encouraged to call back with questions or concerns? Yes  ?

## 2022-04-28 ENCOUNTER — Encounter: Payer: Self-pay | Admitting: Advanced Practice Midwife

## 2022-04-28 ENCOUNTER — Ambulatory Visit (INDEPENDENT_AMBULATORY_CARE_PROVIDER_SITE_OTHER): Payer: Medicaid Other | Admitting: Advanced Practice Midwife

## 2022-04-28 NOTE — Progress Notes (Signed)
? ? ?  Post Partum Visit Note ? ?Dawn Graves is a 26 y.o. G46P1001 female who presents for a postpartum visit. She is 5 weeks postpartum following a vacuum-assisted vaginal delivery.  I have fully reviewed the prenatal and intrapartum course. The delivery was at 40.4  gestational weeks.  Anesthesia: epidural. Postpartum course has been uneventful. Baby is doing well. Baby is feeding by breast. Bleeding thin lochia. Bowel function is normal. Bladder function is normal. Patient is not sexually active. Contraception method is none. Postpartum depression screening: negative. ?score3 ? ?The pregnancy intention screening data noted above was reviewed. Potential methods of contraception were discussed. The patient elected to proceed with No data recorded. ? ?Patient states she is thrilled with the care she has received in the office and at the hospital  ? ?Health Maintenance Due  ?Topic Date Due  ? HPV VACCINES (1 - 2-dose series) Never done  ? ? ?The following portions of the patient's history were reviewed and updated as appropriate: allergies, current medications, past family history, past medical history, past social history, past surgical history, and problem list. ? ?Review of Systems ?Pertinent items are noted in HPI. ?Pertinent items noted in HPI and remainder of comprehensive ROS otherwise negative. ? ?Objective:  ?LMP 05/26/2021   ? ?General:  alert, cooperative, and no distress  ? Breasts:  normal  ?Lungs: clear to auscultation bilaterally  ?Heart:  regular rate and rhythm, S1, S2 normal, no murmur, click, rub or gallop  ?Abdomen: soft, non-tender; bowel sounds normal; no masses,  no organomegaly   ?Wound N/a  ?GU exam:  not indicated  ?     ?Assessment:  ? ?Normal postpartum exam.  ? ?Plan:  ? ?Essential components of care per ACOG recommendations: ? ?1.  Mood and well being: Patient with negative depression screening today. Reviewed local resources for support.  ?- Patient tobacco use? No.   ?- hx of drug  use? No.   ? ?2. Infant care and feeding:  ?-Patient currently breastmilk feeding? Yes. Reviewed importance of draining breast regularly to support lactation.  ?-Social determinants of health (SDOH) reviewed in EPIC. No concerns ? ?3. Sexuality, contraception and birth spacing ?- Patient does not want a pregnancy in the next year.   ?- Reviewed reproductive life planning. Reviewed contraceptive methods based on pt preferences and effectiveness.  Patient desired Abstinence today.   ?- Discussed birth spacing of 18 months ? ?4. Sleep and fatigue ?-Encouraged family/partner/community support of 4 hrs of uninterrupted sleep to help with mood and fatigue ? ?5. Physical Recovery  ?- Discussed patients delivery and complications. She describes her labor as good. ?- Patient had a  Vacuum assisted vaginal delivery . Patient had a 2nd degree laceration. Perineal healing reviewed. Patient expressed understanding ?- Patient has urinary incontinence? No. ?- Patient is safe to resume physical and sexual activity ? ?6.  Health Maintenance ?- HM due items addressed No -   ?- Last pap smear  ?Diagnosis  ?Date Value Ref Range Status  ?09/23/2021   Final  ? - Negative for intraepithelial lesion or malignancy (NILM)  ? Pap smear not done at today's visit.  ?-Breast Cancer screening indicated? No.  ? ?7. Chronic Disease/Pregnancy Condition follow up: None ? ?- PCP follow up ? ?Kathrene Alu, RN ?[facility]  ? ?Seabron Spates, CNM ? ?

## 2022-05-14 ENCOUNTER — Ambulatory Visit: Payer: Medicaid Other | Admitting: Medical

## 2022-05-15 ENCOUNTER — Telehealth: Payer: Self-pay | Admitting: Clinical

## 2022-05-15 NOTE — Telephone Encounter (Signed)
Attempt to call regarding referral; Left HIPPA-compliant message to call back Jamesen Stahnke from [facility] at  [phone] (Sarinity Dicicco's office).   

## 2022-07-02 ENCOUNTER — Telehealth: Payer: Self-pay | Admitting: Clinical

## 2022-07-02 NOTE — Telephone Encounter (Signed)
Attempt to contact pt regarding referral; Left HIPPA-compliant message to call back Keeghan Mcintire from [facility] at  [phone] (Caria Transue's office).   

## 2022-07-12 IMAGING — US US MFM OB DETAIL+14 WK
1 series · 13 of 28 positions shown · non-contrast
Comparison: none

[Series 1: us mfm ob detail+14 wk · 13 of 148 slices shown]
[im 6/148]
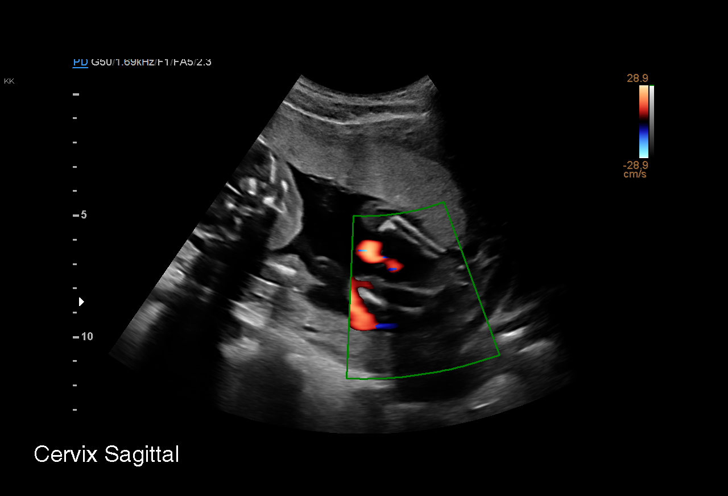
[im 17/148]
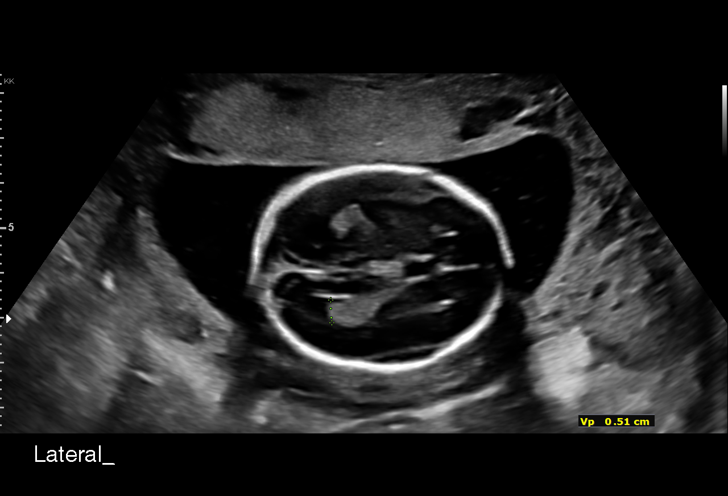
[im 28/148]
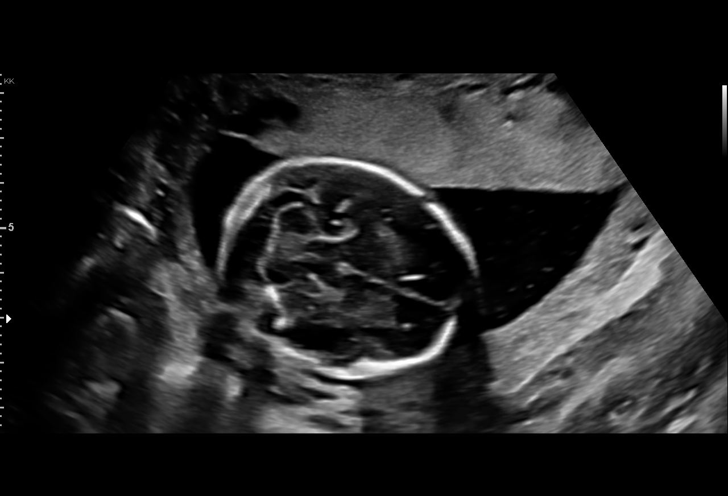
[im 39/148]
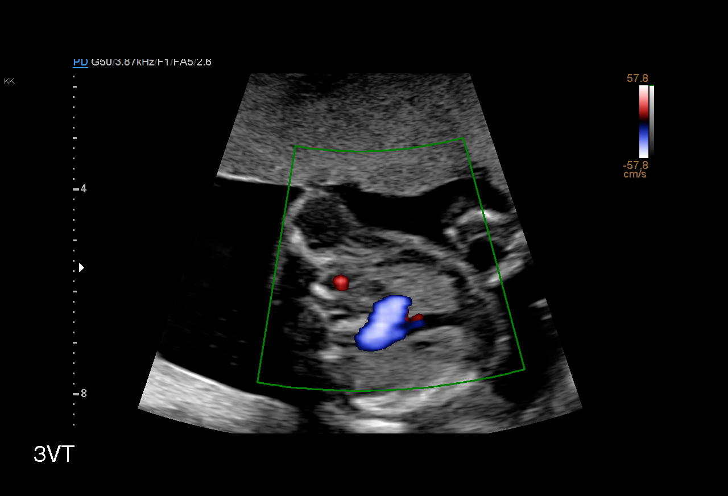
[im 50/148]
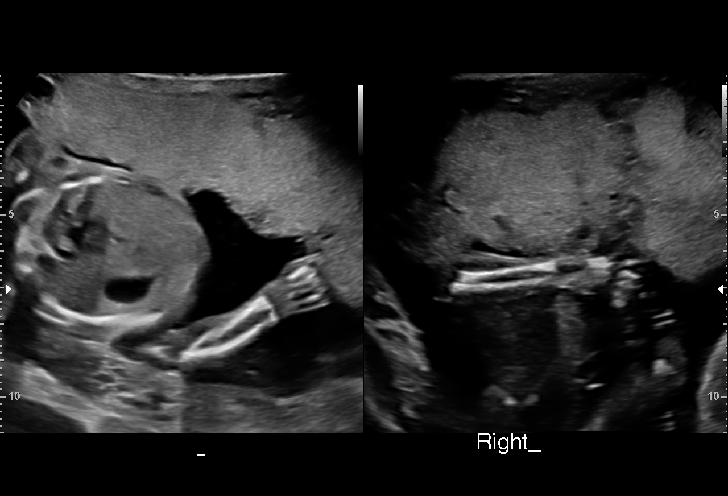
[im 60/148]
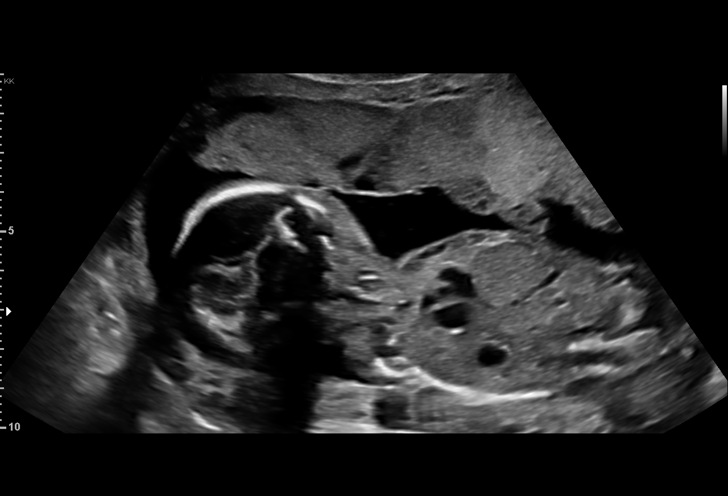
[im 77/148]
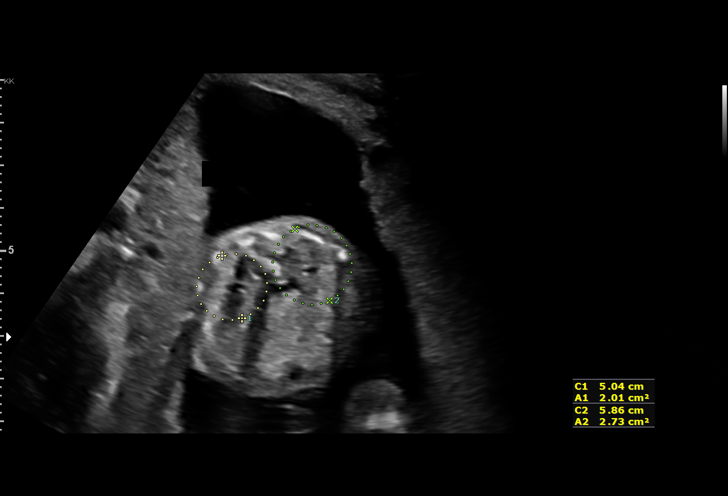
[im 88/148]
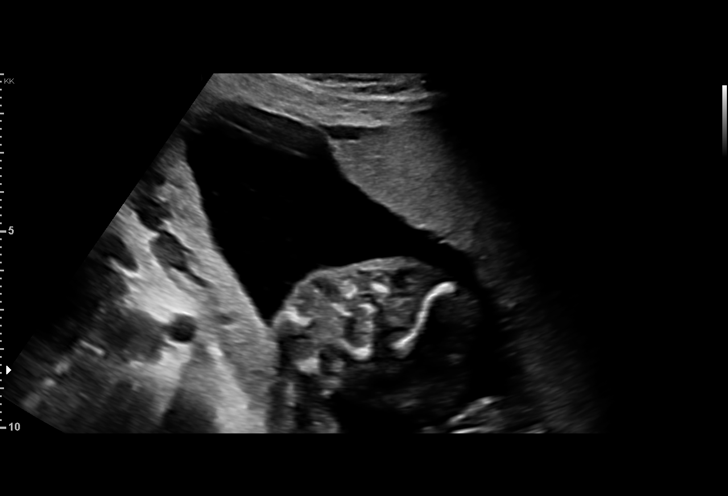
[im 99/148]
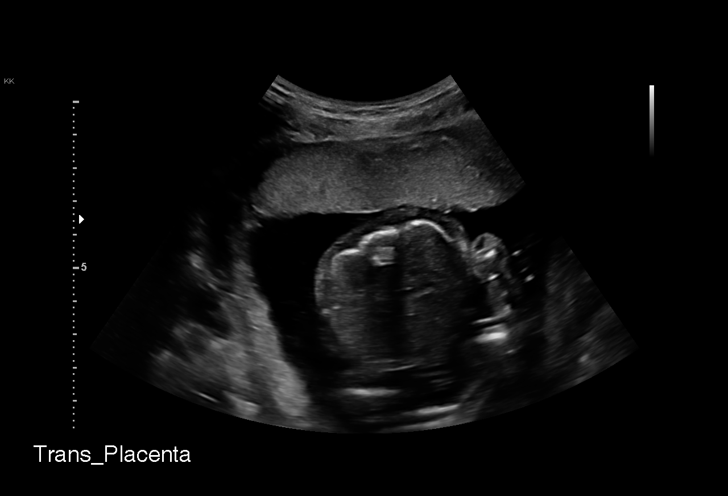
[im 109/148]
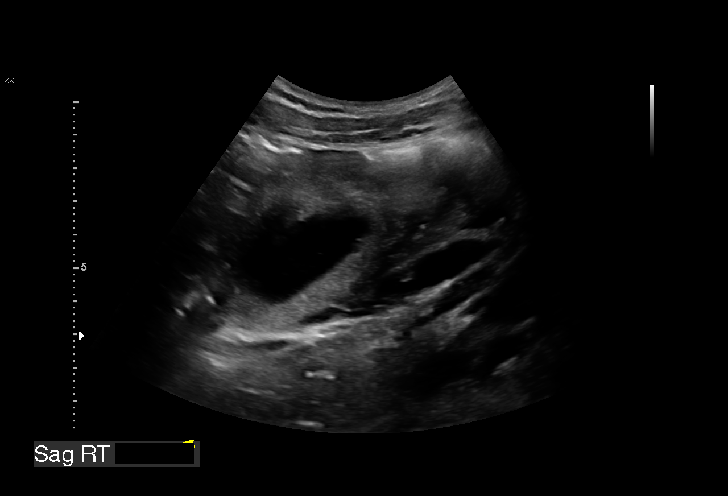
[im 120/148]
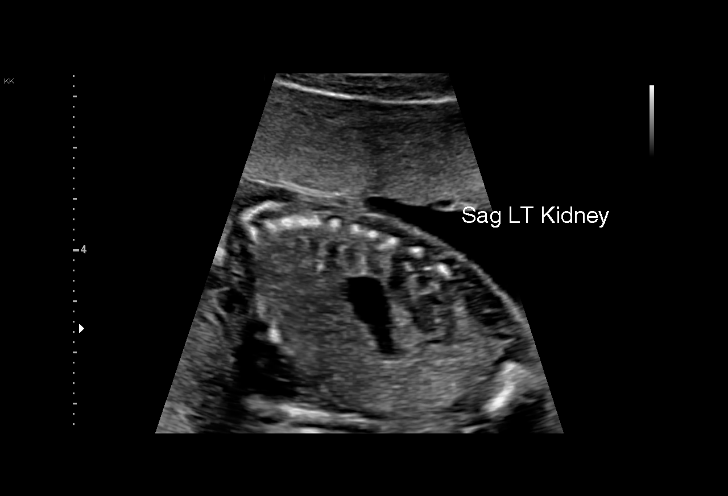
[im 131/148]
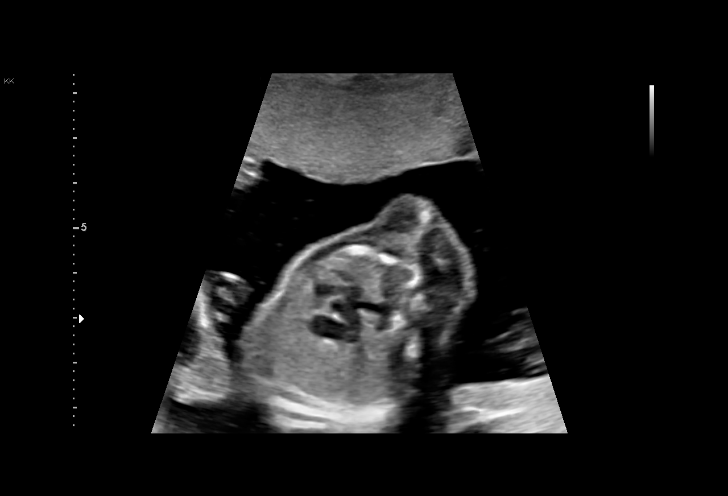
[im 142/148]
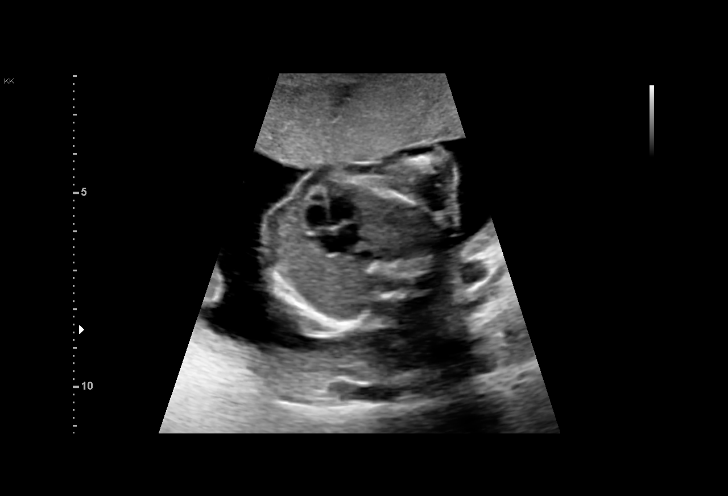

[13 of 28 positions shown; findings below may reference images not displayed]

Indications

 Fetal choroid plexus cyst
 19 weeks gestation of pregnancy
 Encounter for antenatal screening for
 malformations
 Low Risk NIPS(Negative Horizon)
Fetal Evaluation

 Num Of Fetuses:         1
 Fetal Heart Rate(bpm):  147
 Cardiac Activity:       Observed
 Presentation:           Breech
 Placenta:               Anterior
 P. Cord Insertion:      Visualized

 Amniotic Fluid
 AFI FV:      Within normal limits

                             Largest Pocket(cm)
                             4
Biometry

 BPD:      44.8  mm     G. Age:  19w 4d         55  %    CI:         74.3   %    70 - 86
                                                         FL/HC:      20.2   %    16.1 -
 HC:       165   mm     G. Age:  19w 2d         32  %    HC/AC:      1.10        1.09 -
 AC:      149.9  mm     G. Age:  20w 2d         72  %    FL/BPD:     74.6   %
 FL:       33.4  mm     G. Age:  20w 3d         79  %    FL/AC:      22.3   %    20 - 24
 HUM:      31.6  mm     G. Age:  20w 4d         81  %
 CER:      20.4  mm     G. Age:  19w 4d         61  %
 NFT:       4.4  mm
 LV:        5.1  mm
 CM:        2.9  mm

 Est. FW:     339  gm    0 lb 12 oz      87  %
OB History

 Gravidity:    1
Gestational Age

 LMP:           21w 2d        Date:  05/26/21                 EDD:   03/02/22
 U/S Today:     19w 6d                                        EDD:   03/12/22
 Best:          19w 3d     Det. By:  Early Ultrasound         EDD:   03/15/22
                                     (07/23/21)
Anatomy

 Cranium:               Appears normal         LVOT:                   Appears normal
 Cavum:                 Appears normal         Aortic Arch:            Appears normal
 Ventricles:            Appears normal         Ductal Arch:            Appears normal
 Choroid Plexus:        Bilateral choroid      Diaphragm:              Appears normal
                        plexus cysts
 Cerebellum:            Appears normal         Stomach:                Appears normal, left
                                                                       sided
 Posterior Fossa:       Appears normal         Abdomen:                Appears normal
 Nuchal Fold:           Appears normal         Abdominal Wall:         Appears nml (cord
                                                                       insert, abd wall)
 Face:                  Appears normal         Cord Vessels:           Appears normal (3
                        (orbits and profile)                           vessel cord)
 Lips:                  Appears normal         Kidneys:                Appear normal
 Palate:                Not well visualized    Bladder:                Appears normal
 Thoracic:              Appears normal         Spine:                  Appears normal
 Heart:                 Appears normal         Upper Extremities:      Appears normal
                        (4CH, axis, and
                        situs)
 RVOT:                  Appears normal         Lower Extremities:      Appears normal

 Other:  VC, 3VV and 3VTV visualized. Fetus appears to be female. Left Heel
         visualized.
Cervix Uterus Adnexa

 Cervix
 Length:            3.9  cm.
 Normal appearance by transabdominal scan.

 Adnexa
 No abnormality visualized.
Comments

 Ms. Diy is a G1P0 here at 19w 3d who is dated by a 6 w
 3 d ultrasound.

 Single intrauterine pregnancy here for a detailed anatomy
 due to bilateral choroid plexus cyst.
 Normal anatomy with measurements consistent with dates
 There is good fetal movement and amniotic fluid volume

 Ms. Zag had a low risk NIPS

 Choroid plexus cysts (CPCs) are well-demarcated, anechoic,
 fluid-filled structures within the choroid plexus of the lateral
 ventricles of the brain. They are not true cysts in the
 pathologic sense. CPCs are often called "soft sonographic
 signs" or "markers" of aneuploidy, because some studies
 have found an association between them and fetal
 chromosomal abnormalities.

 When the CPCs are isolated, some consider them an
 anatomic variant.  The incidence ranges from 0.18% to 3.6%
 of fetuses scanned in the second trimester. Regardless, the
 potential clinical implications were reviewed with your patient.
 CPCs may be an isolated finding or associated with fetal
 anomalies. Associated anomalies are typically those seen
 with trisomy 18 and include congenital heart disease,
 clenched hands, single umbilical artery, intrauterine growth
 restriction, and Ivelisse Wenger feet. A CPC is not a congenital
 brain defect.  In general, when a choroid plexus cyst is noted
 as an isolated finding and no other high-risk issues are noted,
 invasive karyotype analysis via an amniocentesis is usually
 not recommended.

 Ms. Diy understood today's counseling and had no
 further questions
 I spent 20 minutes with > 50% in face to face counseling.

## 2022-11-14 IMAGING — US US MFM FETAL BPP W/ NON-STRESS
1 series · 13 of 28 positions shown · non-contrast
Comparison: none

[Series 1: us mfm fetal bpp w/ non-stress · 13 of 51 slices shown]
[im 2/51]
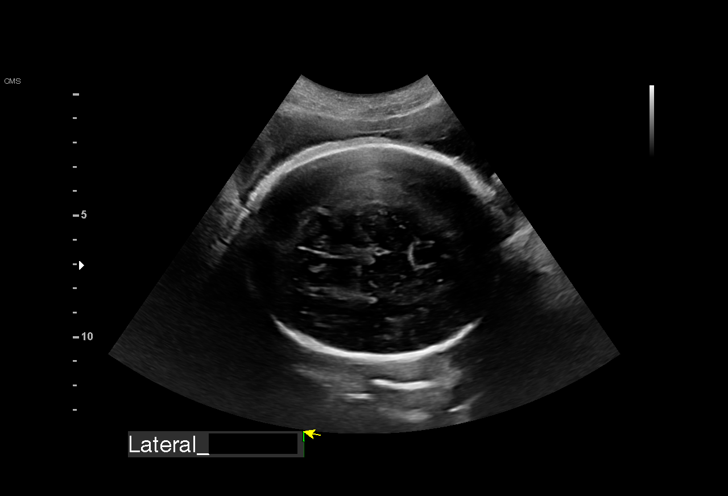
[im 6/51]
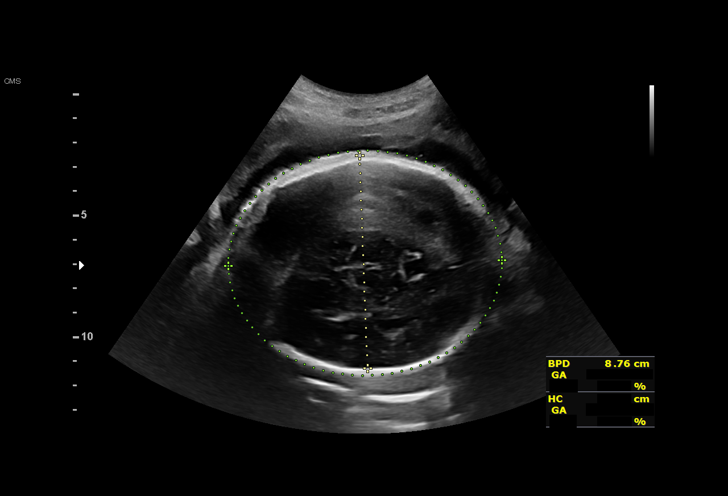
[im 10/51]
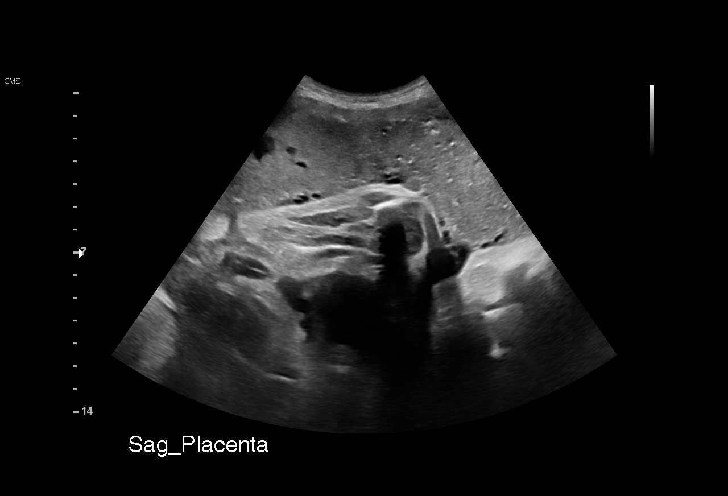
[im 13/51]
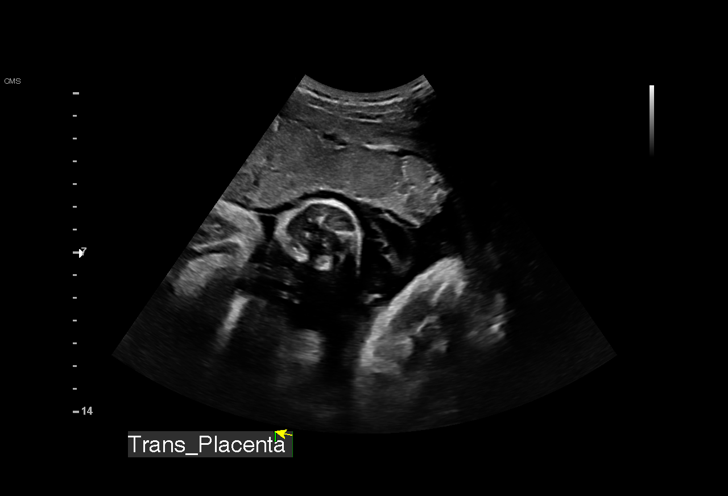
[im 17/51]
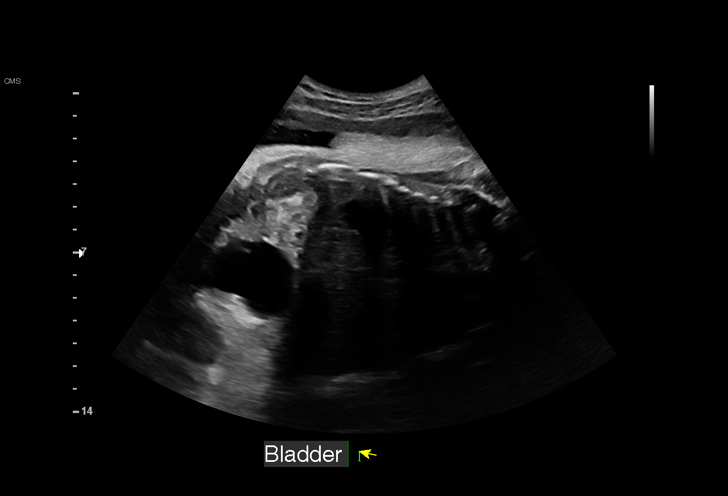
[im 21/51]
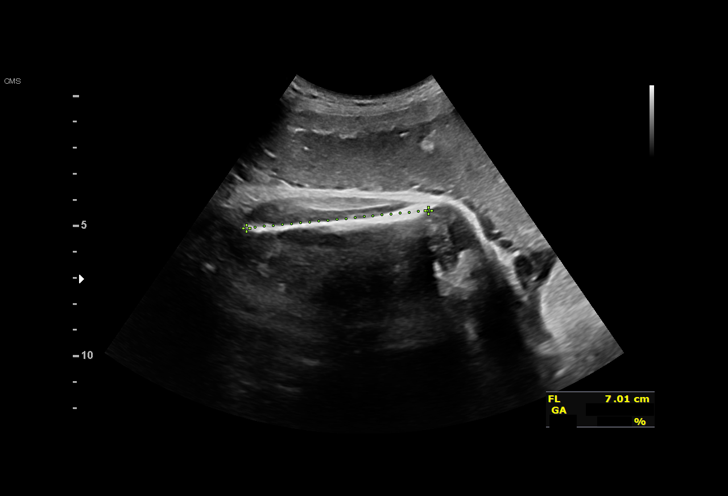
[im 26/51]
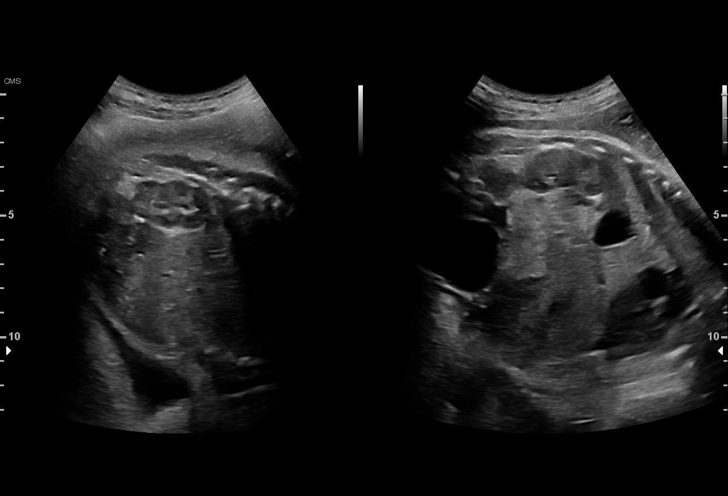
[im 30/51]
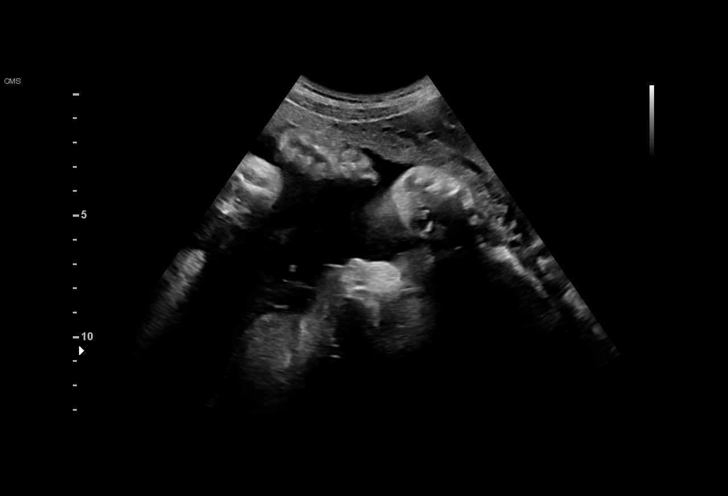
[im 34/51]
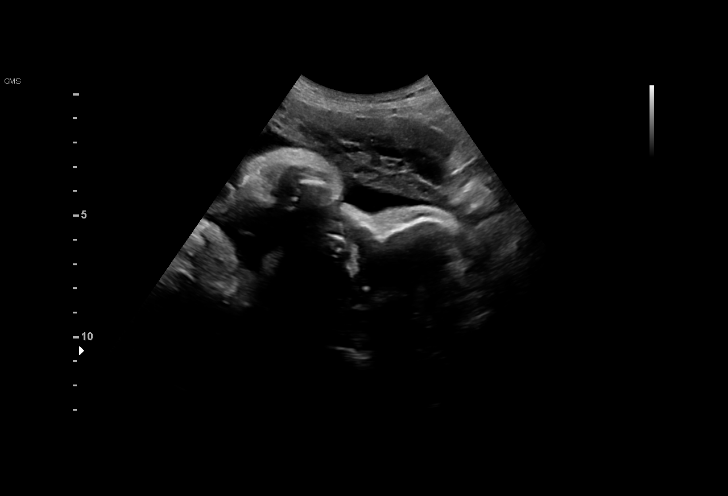
[im 38/51]
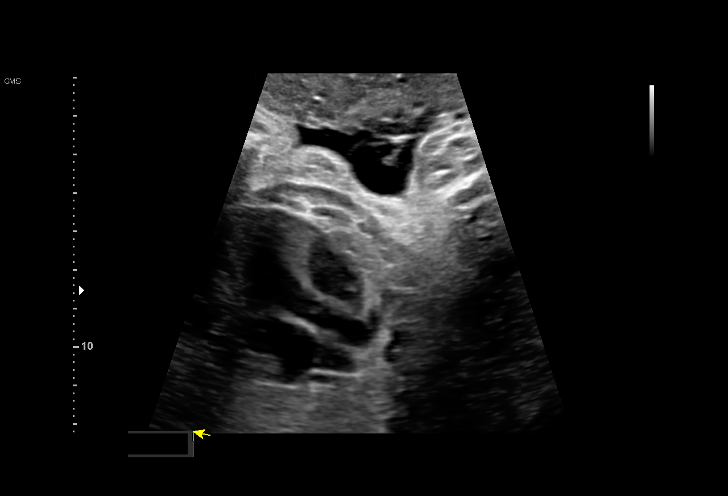
[im 41/51]
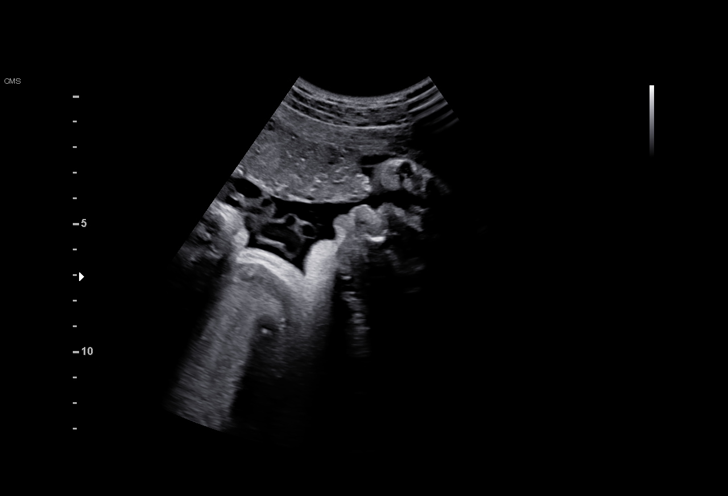
[im 45/51]
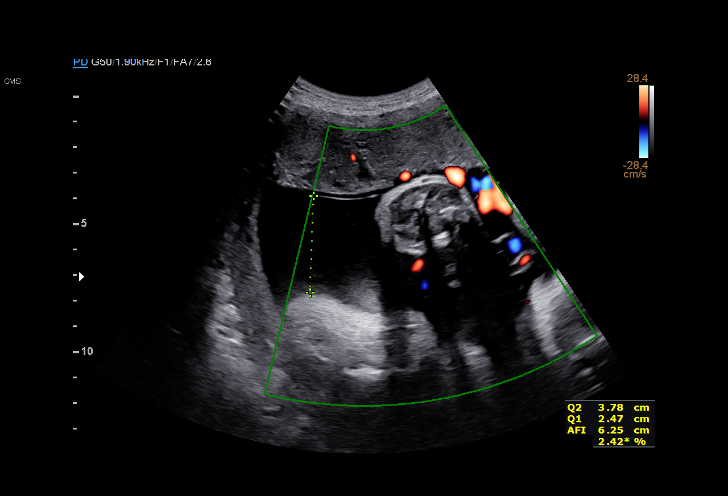
[im 49/51]
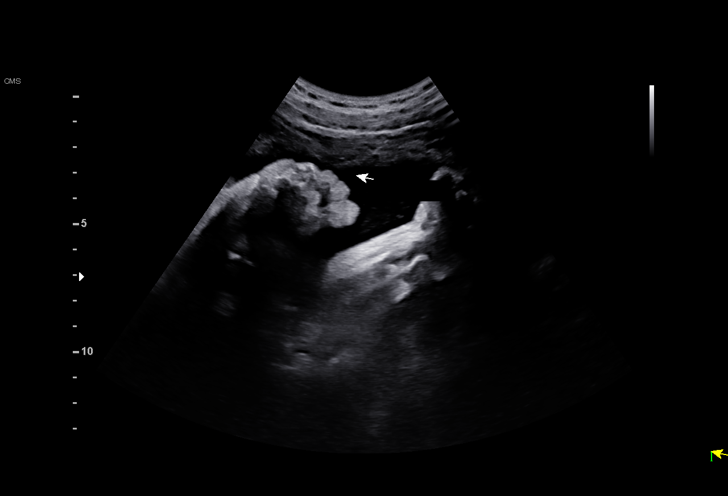

[13 of 28 positions shown; findings below may reference images not displayed]

W/NONSTRESS

Indications

 Size of fetus inconsistent with dates in third
 trimester
 Decreased fetal movement
 37 weeks gestation of pregnancy
 Fetal choroid plexus cyst
 Low Risk NIPS(Negative Horizon)
 Encounter for other antenatal screening
 follow-up
Fetal Evaluation

 Num Of Fetuses:         1
 Fetal Heart Rate(bpm):  130
 Cardiac Activity:       Observed
 Presentation:           Cephalic
 Placenta:               Anterior
 P. Cord Insertion:      Previously Visualized

 Amniotic Fluid
 AFI FV:      Within normal limits

 AFI Sum(cm)     %Tile       Largest Pocket(cm)
 7.7             7

 RUQ(cm)                     LUQ(cm)        LLQ(cm)

Biophysical Evaluation

 Amniotic F.V:   Within normal limits       F. Tone:        Observed
 F. Movement:    Not Observed               N.S.T:          Reactive
 F. Breathing:   Observed                   Score:          [DATE]
Biometry

 BPD:      87.1  mm     G. Age:  35w 1d         13  %    CI:        74.06   %    70 - 86
                                                         FL/HC:      21.9   %    20.8 -
 HC:      321.4  mm     G. Age:  36w 2d          9  %    HC/AC:      0.96        0.92 -
 AC:      333.3  mm     G. Age:  37w 2d         64  %    FL/BPD:     80.7   %    71 - 87
 FL:       70.3  mm     G. Age:  36w 0d         20  %    FL/AC:      21.1   %    20 - 24
 LV:        2.4  mm

 Est. FW:    6326  gm      6 lb 9 oz     39  %
OB History

 Gravidity:    1
Gestational Age

 LMP:           39w 1d        Date:  05/26/21                 EDD:   03/02/22
 U/S Today:     36w 1d                                        EDD:   03/23/22
 Best:          37w 2d     Det. By:  Early Ultrasound         EDD:   03/15/22
                                     (07/23/21)
Anatomy

 Cranium:               Appears normal         Aortic Arch:            Previously seen
 Cavum:                 Appears normal         Ductal Arch:            Previously seen
 Ventricles:            Appears normal         Diaphragm:              Previously seen
 Choroid Plexus:        Previously seen        Stomach:                Appears normal, left
                                                                       sided
 Cerebellum:            Previously seen        Abdomen:                Previously seen
 Posterior Fossa:       Previously seen        Abdominal Wall:         Previously seen
 Nuchal Fold:           Previously seen        Cord Vessels:           Previously seen
 Face:                  Orbits and profile     Kidneys:                Appear normal
                        previously seen
 Lips:                  Appears normal         Bladder:                Appears normal
 Thoracic:              Appears normal         Spine:                  Previously seen
 Heart:                 Previously seen        Upper Extremities:      Previously seen
 RVOT:                  Previously seen        Lower Extremities:      Previously seen
 LVOT:                  Appears normal

 Other:  Heels, VC, 3VV and 3VTV visualized previously. Female gender
         previously seen. Technically difficult due to advanced GA and fetal
         position.
Cervix Uterus Adnexa

 Cervix
 Not visualized (advanced GA >13wks)
 Uterus
 No abnormality visualized.

 Right Ovary
 Not visualized.

 Left Ovary
 Not visualized.

 Cul De Sac
 No free fluid seen.

 Adnexa
 No abnormality visualized.
Comments

 This patient was seen for a follow up growth scan and BPP
 as her fundal heights have measured less than her
 gestational age.  She denies any problems since her last
 exam and reports feeling fetal movements throughout the day.
 She was informed that the fetal growth measures appropriate
 for her gestational age (EFW 6 pounds 9 ounces, 39th
 percentile).  Low normal amniotic fluid with a total AFI of
 cm was noted.
 A BPP performed today was [DATE].  She received a -2 for
 fetal breathing movements that did not meet criteria.  She
 subsequently had a reactive NST making her total
 biophysical profile score [DATE].
 The patient was advised that her fundal heights may be
 measuring less than her dates due to the low normal amniotic
 fluid levels.
 Due to the low normal amniotic fluid levels, we will continue
 to follow her with weekly BPP's and amniotic fluid checks.
 The patient understands that delivery may be recommended
 prior to her due date should the total AFI dropped below 5 or
 if a maximal vertical pocket of 2 cm or greater cannot be
 found.
 Another BPP was scheduled in 1 week.

## 2022-12-22 ENCOUNTER — Encounter (HOSPITAL_BASED_OUTPATIENT_CLINIC_OR_DEPARTMENT_OTHER): Payer: Self-pay | Admitting: Emergency Medicine

## 2022-12-22 ENCOUNTER — Emergency Department (HOSPITAL_BASED_OUTPATIENT_CLINIC_OR_DEPARTMENT_OTHER)
Admission: EM | Admit: 2022-12-22 | Discharge: 2022-12-22 | Disposition: A | Discharge status: Home / Self Care | Payer: Medicaid Other | Attending: Emergency Medicine | Admitting: Emergency Medicine

## 2022-12-22 ENCOUNTER — Other Ambulatory Visit: Payer: Self-pay

## 2022-12-22 DIAGNOSIS — S61226A Laceration with foreign body of right little finger without damage to nail, initial encounter: Secondary | ICD-10-CM | POA: Diagnosis not present

## 2022-12-22 DIAGNOSIS — W268XXA Contact with other sharp object(s), not elsewhere classified, initial encounter: Secondary | ICD-10-CM | POA: Insufficient documentation

## 2022-12-22 DIAGNOSIS — S61209A Unspecified open wound of unspecified finger without damage to nail, initial encounter: Secondary | ICD-10-CM

## 2022-12-22 DIAGNOSIS — S61216A Laceration without foreign body of right little finger without damage to nail, initial encounter: Secondary | ICD-10-CM | POA: Diagnosis not present

## 2022-12-22 NOTE — ED Provider Notes (Signed)
[facility] EMERGENCY DEPARTMENT Provider Note   CSN: [id number] Arrival date & time: 12/22/22  1909     History  Chief Complaint  Patient presents with   Laceration    Dawn Graves is a 27 y.o. female.  27 year old female presents to the emergency department for evaluation of laceration to her right fifth digit.  She was using a mandolin when she accidentally sliced her affected finger.  She applied bandages prior to arrival for bleeding control.  Currently rates her pain at 3/10.  She has not taken any medications prior to arrival for symptoms.  Reports that her tetanus was last updated 1 year ago. Patient is right hand dominant.  The history is provided by the patient. No language interpreter was used.  Laceration      Home Medications Prior to Admission medications   Medication Sig Start Date End Date Taking? Authorizing Provider  acetaminophen (TYLENOL) 500 MG tablet Take 2 tablets (1,000 mg total) by mouth every 8 (eight) hours as needed (pain). 03/21/22   Theresia Majors, MD  ibuprofen (ADVIL) 600 MG tablet Take 1 tablet (600 mg total) by mouth every 6 (six) hours as needed. 03/21/22   Theresia Majors, MD  polyethylene glycol powder (GLYCOLAX/MIRALAX) 17 GM/SCOOP powder Take 17 g by mouth daily. 03/21/22   Theresia Majors, MD  Prenatal Vit-Fe Fumarate-FA (PRENATAL VITAMIN) 27-0.8 MG TABS Take 1 tablet by mouth daily at 6 (six) AM. 09/23/21   Aviva Signs, CNM      Allergies    Strawberry extract    Review of Systems   Review of Systems Ten systems reviewed and are negative for acute change, except as noted in the HPI.    Physical Exam Updated Vital Signs BP 118/85 (BP Location: Left Arm)   Pulse 92   Temp 98.1 F (36.7 C)   Resp 16   Ht 5\' 11"  (1.803 m)   Wt 54 kg   LMP  (LMP Unknown)   SpO2 99%   BMI 16.60 kg/m   Physical Exam Vitals and nursing note reviewed.  Constitutional:      General: She is not in acute distress.    Appearance: She is  well-developed. She is not diaphoretic.  HENT:     Head: Normocephalic and atraumatic.  Eyes:     General: No scleral icterus.    Conjunctiva/sclera: Conjunctivae normal.  Pulmonary:     Effort: Pulmonary effort is normal. No respiratory distress.  Musculoskeletal:        General: Normal range of motion.     Cervical back: Normal range of motion.     Comments: Deep skin avulsion to the distal tip of the right fifth digit.  Bleeding slow and controlled with pressure.  No nail involvement.  Skin:    General: Skin is warm and dry.     Coloration: Skin is not pale.     Findings: No erythema or rash.  Neurological:     Mental Status: She is alert and oriented to person, place, and time.  Psychiatric:        Behavior: Behavior normal.     ED Results / Procedures / Treatments   Labs (all labs ordered are listed, but only abnormal results are displayed) Labs Reviewed - No data to display  EKG None  Radiology No results found.  Procedures Procedures    LACERATION REPAIR Performed by: Authorized by: Antony Madura Consent: Verbal consent obtained. Risks and benefits: risks,  benefits and alternatives were discussed Consent given by: patient Patient identity confirmed: provided demographic data Prepped and Draped in normal sterile fashion Wound explored  Laceration Location: R 5th finger  Laceration Length: 0.5cm  No Foreign Bodies seen or palpated  Amount of cleaning: standard  Skin closure: Dermabond  Technique: simple  Patient tolerance: Patient tolerated the procedure well with no immediate complications.   Medications Ordered in ED Medications - No data to display  ED Course/ Medical Decision Making/ [name]                           Medical Decision Making  This patient presents to the ED for concern of wound or R 5th finger, this involves an extensive number of treatment options, and is a complaint that carries with it a high risk of  complications and morbidity.  The differential diagnosis includes laceration vs avulsion vs partial or complete amputation   Co morbidities that complicate the patient evaluation  none   Additional history obtained:  External records from outside source obtained and reviewed including baseline Hgb of 12.5, last in 03/2022   Cardiac Monitoring:  The patient was maintained on a cardiac monitor.  I personally viewed and interpreted the cardiac monitored which showed an underlying rhythm of: NSR   Medicines ordered and prescription drug management:  I ordered medication including dermabond for wound care  Reevaluation of the patient after these medicines showed that the patient improved I have reviewed the patients home medicines and have made adjustments as needed   Test Considered:  Xray finger - however no evidence of bony involvement on exam.   Problem List / ED Course:  Tdap booster UTD. Pt has no comorbidities to effect normal wound healing.  Discussed suture home care w pt and answered questions.    Reevaluation:  After the interventions noted above, I reevaluated the patient and found that they have :stayed the same   Social Determinants of Health:  Insured patient   Dispostion:  After consideration of the diagnostic results and the patients response to treatment, I feel that the patent would benefit from outpatient wound care. Tylenol or Motrin for pain PRN. Return precautions discussed and provided. Patient discharged in stable condition with no unaddressed concerns.          Final Clinical Impression(s) / ED Diagnoses Final diagnoses:  Avulsion of skin of finger, initial encounter    Rx / DC Orders ED Discharge Orders     None         Antonietta Breach, Hershal Coria 12/22/22 2106    Fransico Meadow, MD 12/22/22 [phone]

## 2022-12-22 NOTE — Discharge Instructions (Addendum)
Take tylenol or ibuprofen for pain. Avoid soaking your wound in stagnant or dirty water such as while taking a bath. You can shower normally. Keep the area clean with mild soap and warm water. Do not apply peroxide or alcohol to your wound as this can break down newly forming skin and prolong wound healing. If you keep the area bandaged, change the dressing/bandage at least once per day.

## 2022-12-22 NOTE — ED Triage Notes (Signed)
Patient arrived via POV c/o laceration to right pinky while using a mandolin. Patient states 3/10 pain. Bleeding well controlled. Patient is AO x 4, VS w/ elevated BP, normal gait.

## 2022-12-24 ENCOUNTER — Telehealth: Payer: Self-pay | Admitting: *Deleted

## 2022-12-24 NOTE — Patient Outreach (Signed)
  Care Coordination [facility] Note Transition Care Management Unsuccessful Follow-up Telephone Call  Date of discharge and from where:  12/22/22 from [facility] ED  Attempts:  1st Attempt  Reason for unsuccessful TCM follow-up call:  Left voice message   Lurena Joiner RN, BSN [place] RN Care Coordinator

## 2023-01-05 ENCOUNTER — Other Ambulatory Visit (HOSPITAL_BASED_OUTPATIENT_CLINIC_OR_DEPARTMENT_OTHER): Payer: Self-pay

## 2023-01-05 ENCOUNTER — Encounter: Payer: Self-pay | Admitting: Advanced Practice Midwife

## 2023-01-05 ENCOUNTER — Ambulatory Visit (INDEPENDENT_AMBULATORY_CARE_PROVIDER_SITE_OTHER): Payer: Medicaid Other | Admitting: Advanced Practice Midwife

## 2023-01-05 VITALS — BP 113/84 | HR 72 | Wt 123.0 lb

## 2023-01-05 DIAGNOSIS — Z3202 Encounter for pregnancy test, result negative: Secondary | ICD-10-CM

## 2023-01-05 DIAGNOSIS — Z30011 Encounter for initial prescription of contraceptive pills: Secondary | ICD-10-CM

## 2023-01-05 DIAGNOSIS — Z01419 Encounter for gynecological examination (general) (routine) without abnormal findings: Secondary | ICD-10-CM

## 2023-01-05 LAB — POCT URINE PREGNANCY: Preg Test, Ur: NEGATIVE

## 2023-01-05 MED ORDER — NORETHIN-ETH ESTRADIOL-FE 0.4-35 MG-MCG PO CHEW: 1.0000 | CHEWABLE_TABLET | Freq: Every day | ORAL | Status: DC

## 2023-01-05 MED ORDER — NORETHINDRONE-ETH ESTRADIOL 0.4-35 MG-MCG PO TABS
1.0000 | ORAL_TABLET | Freq: Every day | ORAL | 11 refills | Status: DC
  Filled 2023-01-05: qty 28, 28d supply, fill #0
  Filled 2023-01-27: qty 28, 28d supply, fill #1
  Filled 2023-02-21: qty 28, 28d supply, fill #2

## 2023-01-05 NOTE — Progress Notes (Signed)
GYNECOLOGY ANNUAL PREVENTATIVE CARE ENCOUNTER NOTE  Subjective:   Dawn Graves is a 27 y.o. G52P1001 female here for a routine annual gynecologic exam.  Current complaints: wants to start oral contraceptives.   Denies abnormal vaginal bleeding, discharge, pelvic pain, problems with intercourse or other gynecologic concerns.  S/P SVD on 03/19/22   Baby doing well.    Gynecologic History Patient's last menstrual period was 12/26/2022 (exact date). Contraception: none Last Pap: 2022. Results were: normal   Obstetric History OB History  Gravida Para Term Preterm AB Living  1 1 1     1   SAB IAB Ectopic Multiple Live Births        0 1    # Outcome Date GA Lbr Len/2nd Weight Sex Delivery Anes PTL Lv  1 Term 03/19/22 [redacted]w[redacted]d / 11:54 6 lb 5.9 oz (2.89 kg) F Vag-Vacuum EPI  LIV    Past Medical History:  Diagnosis Date   Immunization, varicella    Lab confirmed immunity on 07/02/2016, results sent for scanning   Medical history non-contributory     Past Surgical History:  Procedure Laterality Date   NO PAST SURGERIES      Current Outpatient Medications on File Prior to Visit  Medication Sig Dispense Refill   acetaminophen (TYLENOL) 500 MG tablet Take 2 tablets (1,000 mg total) by mouth every 8 (eight) hours as needed (pain). (Patient not taking: Reported on 01/05/2023) 100 tablet 0   ibuprofen (ADVIL) 600 MG tablet Take 1 tablet (600 mg total) by mouth every 6 (six) hours as needed. (Patient not taking: Reported on 01/05/2023) 40 tablet 0   polyethylene glycol powder (GLYCOLAX/MIRALAX) 17 GM/SCOOP powder Take 17 g by mouth daily. (Patient not taking: Reported on 01/05/2023) 510 g 0   Prenatal Vit-Fe Fumarate-FA (PRENATAL VITAMIN) 27-0.8 MG TABS Take 1 tablet by mouth daily at 6 (six) AM. (Patient not taking: Reported on 01/05/2023) 100 tablet 12   No current facility-administered medications on file prior to visit.    Allergies  Allergen Reactions   Strawberry Extract Other (See  Comments)    unknown    Social History   Socioeconomic History   Marital status: Single    Spouse name: Not on file   Number of children: Not on file   Years of education: Not on file   Highest education level: High school graduate  Occupational History   Not on file  Tobacco Use   Smoking status: Never   Smokeless tobacco: Never  Vaping Use   Vaping Use: Some days   Substances: THC  Substance and Sexual Activity   Alcohol use: Yes    Comment: occasionally   Drug use: No   Sexual activity: Yes  Other Topics Concern   Not on file  Social History Narrative   Not on file   Social Determinants of Health   Financial Resource Strain: Not on file  Food Insecurity: Not on file  Transportation Needs: Not on file  Physical Activity: Not on file  Stress: Not on file  Social Connections: Not on file  Intimate Partner Violence: Not on file    Family History  Problem Relation Age of Onset   Hypertension Maternal Grandmother    Diabetes Maternal Grandmother    Hypertension Paternal Grandmother    Diabetes Paternal Grandmother    Sudden death Neg Hx    Heart attack Neg Hx    Cancer Neg Hx     The following portions of the patient's history were reviewed  and updated as appropriate: allergies, current medications, past family history, past medical history, past social history, past surgical history and problem list.  Review of Systems Pertinent items noted in HPI and remainder of comprehensive ROS otherwise negative.   Objective:  BP 113/84   Pulse 72   Wt 123 lb (55.8 kg)   LMP 12/26/2022 (Exact Date)   Breastfeeding No   BMI 17.16 kg/m  CONSTITUTIONAL: Well-developed, well-nourished female in no acute distress.  HENT:  Normocephalic, atraumatic, External right and left ear normal. Oropharynx is clear and moist EYES: Conjunctivae and EOM are normal. No scleral icterus.  NECK: Normal range of motion, supple, no masses.  Normal thyroid.  SKIN: Skin is warm and dry.  No rash noted. Not diaphoretic. No erythema. No pallor. NEUROLOGIC: Alert and oriented to person, place, and time. Normal reflexes, muscle tone coordination. No cranial nerve deficit noted. PSYCHIATRIC: Normal mood and affect. Normal behavior. Normal judgment and thought content. CARDIOVASCULAR: Normal heart rate noted, regular rhythm RESPIRATORY: Clear to auscultation bilaterally. Effort and breath sounds normal, no problems with respiration noted. BREASTS: Symmetric in size. No masses, skin changes, nipple drainage, or lymphadenopathy. ABDOMEN: Soft, normal bowel sounds, no distention noted.  No tenderness, rebound or guarding.  PELVIC: Declines pelvic exam MUSCULOSKELETAL: Normal range of motion. No tenderness.  No cyanosis, clubbing, or edema.     Assessment:  Annual gynecologic examination without pap smear Desires to initiate oral contraceptives   Plan:  Will follow up results of pap smear and manage accordingly. Rx Ovcon 35 for oral contraceptive.  Reviewed how to take Recheck BP in one month Routine preventative health maintenance measures emphasized. Please refer to After Visit Summary for other counseling recommendations.

## 2023-01-06 ENCOUNTER — Other Ambulatory Visit (HOSPITAL_BASED_OUTPATIENT_CLINIC_OR_DEPARTMENT_OTHER): Payer: Self-pay

## 2023-01-06 DIAGNOSIS — Z30011 Encounter for initial prescription of contraceptive pills: Secondary | ICD-10-CM | POA: Insufficient documentation

## 2023-01-08 ENCOUNTER — Emergency Department (HOSPITAL_BASED_OUTPATIENT_CLINIC_OR_DEPARTMENT_OTHER)
Admission: EM | Admit: 2023-01-08 | Discharge: 2023-01-08 | Disposition: A | Discharge status: Home / Self Care | Payer: Managed Care, Other (non HMO) | Attending: Emergency Medicine | Admitting: Emergency Medicine

## 2023-01-08 ENCOUNTER — Other Ambulatory Visit: Payer: Self-pay

## 2023-01-08 ENCOUNTER — Emergency Department (HOSPITAL_BASED_OUTPATIENT_CLINIC_OR_DEPARTMENT_OTHER): Payer: Managed Care, Other (non HMO)

## 2023-01-08 DIAGNOSIS — R109 Unspecified abdominal pain: Secondary | ICD-10-CM | POA: Diagnosis not present

## 2023-01-08 DIAGNOSIS — M545 Low back pain, unspecified: Secondary | ICD-10-CM | POA: Diagnosis not present

## 2023-01-08 DIAGNOSIS — N3000 Acute cystitis without hematuria: Secondary | ICD-10-CM | POA: Insufficient documentation

## 2023-01-08 LAB — URINALYSIS, ROUTINE W REFLEX MICROSCOPIC
Specific Gravity, Urine: 1.015 (ref 1.005–1.030)
pH: 5 (ref 5.0–8.0)

## 2023-01-08 LAB — URINALYSIS, MICROSCOPIC (REFLEX)

## 2023-01-08 LAB — PREGNANCY, URINE: Preg Test, Ur: NEGATIVE

## 2023-01-08 MED ORDER — CEPHALEXIN 500 MG PO CAPS: 500.0000 mg | ORAL_CAPSULE | Freq: Two times a day (BID) | ORAL | 0 refills | Status: DC

## 2023-01-08 NOTE — ED Notes (Signed)
Spoke to lab and GC/Chlamydia will be added to existing UA sample

## 2023-01-08 NOTE — ED Provider Notes (Signed)
[place] [facility]ERGENCY DEPARTMENT [facility] Provider Note   CSN: [id number] Arrival date & time: 01/08/23  1446     History Chief Complaint  Patient presents with   Flank Pain    Dawn Graves is a 27 y.o. female.   Flank Pain Associated symptoms include abdominal pain.  Patient presents emergency department complaints of left-sided flank pain and low back pain.  Patient reports that when she was previously had UTIs that these have been her similar symptoms.  Patient also reports that he is concerned about possible STI exposure she has a new sexual partner and had a painful sexual encounter 2 days ago.  Denies dysuria, hematuria.  Patient was previously tested for STIs several years ago and all was negative at that time.  Patient recently had live birth of newborn 49 months prior, vaginal delivery complicated by chorioamnionitis.     Home Medications Prior to Admission medications   Medication Sig Start Date End Date Taking? Authorizing Provider  cephALEXin (KEFLEX) 500 MG capsule Take 1 capsule (500 mg total) by mouth 2 (two) times daily. 01/08/23  Yes Luvenia Heller, PA-C  acetaminophen (TYLENOL) 500 MG tablet Take 2 tablets (1,000 mg total) by mouth every 8 (eight) hours as needed (pain). Patient not taking: Reported on 01/05/2023 03/21/22   Daniel Nones, MD  ibuprofen (ADVIL) 600 MG tablet Take 1 tablet (600 mg total) by mouth every 6 (six) hours as needed. Patient not taking: Reported on 01/05/2023 03/21/22   Daniel Nones, MD  norethindrone-ethinyl estradiol (OVCON-35) 0.4-35 MG-MCG tablet Take 1 tablet by mouth daily. 01/05/23   Seabron Spates, CNM  polyethylene glycol powder (GLYCOLAX/MIRALAX) 17 GM/SCOOP powder Take 17 g by mouth daily. Patient not taking: Reported on 01/05/2023 03/21/22   Daniel Nones, MD  Prenatal Vit-Fe Fumarate-FA (PRENATAL VITAMIN) 27-0.8 MG TABS Take 1 tablet by mouth daily at 6 (six) AM. Patient not taking: Reported on 01/05/2023 09/23/21    Seabron Spates, CNM      Allergies    Strawberry extract    Review of Systems   Review of Systems  Constitutional:  Negative for appetite change, chills, fatigue and fever.  Gastrointestinal:  Positive for abdominal pain. Negative for diarrhea, nausea and vomiting.  Genitourinary:  Positive for dyspareunia, flank pain, frequency, pelvic pain and urgency. Negative for dysuria and hematuria.  All other systems reviewed and are negative.   Physical Exam Updated Vital Signs BP (!) 128/94 (BP Location: Right Arm)   Pulse 90   Temp 98.1 F (36.7 C)   Resp 16   Ht 5\' 11"  (1.803 m)   Wt 55.8 kg   LMP 12/26/2022 (Exact Date)   SpO2 98%   BMI 17.16 kg/m  Physical Exam Vitals and nursing note reviewed.  Constitutional:      Appearance: Normal appearance.  HENT:     Head: Normocephalic and atraumatic.  Eyes:     Conjunctiva/sclera: Conjunctivae normal.  Cardiovascular:     Rate and Rhythm: Normal rate and regular rhythm.     Pulses: Normal pulses.     Heart sounds: Normal heart sounds.  Pulmonary:     Effort: Pulmonary effort is normal.     Breath sounds: Normal breath sounds.  Abdominal:     General: Abdomen is flat. Bowel sounds are normal.  Skin:    General: Skin is warm and dry.     Capillary Refill: Capillary refill takes less than 2 seconds.  Neurological:  General: No focal deficit present.     Mental Status: She is alert.     ED Results / Procedures / Treatments   Labs (all labs ordered are listed, but only abnormal results are displayed) Labs Reviewed  URINALYSIS, ROUTINE W REFLEX MICROSCOPIC - Abnormal; Notable for the following components:      Result Value   Color, Urine ORANGE (*)    APPearance HAZY (*)    Glucose, UA   (*)    Value: TEST NOT REPORTED DUE TO COLOR INTERFERENCE OF URINE PIGMENT   Hgb urine dipstick   (*)    Value: TEST NOT REPORTED DUE TO COLOR INTERFERENCE OF URINE PIGMENT   Bilirubin Urine   (*)    Value: TEST NOT REPORTED  DUE TO COLOR INTERFERENCE OF URINE PIGMENT   Ketones, ur   (*)    Value: TEST NOT REPORTED DUE TO COLOR INTERFERENCE OF URINE PIGMENT   Protein, ur   (*)    Value: TEST NOT REPORTED DUE TO COLOR INTERFERENCE OF URINE PIGMENT   Nitrite   (*)    Value: TEST NOT REPORTED DUE TO COLOR INTERFERENCE OF URINE PIGMENT   Leukocytes,Ua   (*)    Value: TEST NOT REPORTED DUE TO COLOR INTERFERENCE OF URINE PIGMENT   All other components within normal limits  URINALYSIS, MICROSCOPIC (REFLEX) - Abnormal; Notable for the following components:   Bacteria, UA MANY (*)    All other components within normal limits  PREGNANCY, URINE  HIV ANTIBODY (ROUTINE TESTING W REFLEX)  GC/CHLAMYDIA PROBE AMP ([place]) NOT AT [facility]    EKG None  Radiology CT ABDOMEN PELVIS WO CONTRAST  Result Date: 01/08/2023 CLINICAL DATA:  Right lower quadrant abdominal pain. Flank and pelvic pain. EXAM: CT ABDOMEN AND PELVIS WITHOUT CONTRAST TECHNIQUE: Multidetector CT imaging of the abdomen and pelvis was performed following the standard protocol without IV contrast. RADIATION DOSE REDUCTION: This exam was performed according to the departmental dose-optimization program which includes automated exposure control, adjustment of the mA and/or kV according to patient size and/or use of iterative reconstruction technique. COMPARISON:  None Available. FINDINGS: Lower chest: Clear lung bases. Hepatobiliary: The unenhanced liver is unremarkable. Gallbladder is minimally distended. No calcified gallstone or pericholecystic inflammation. No biliary dilatation. Pancreas: No ductal dilatation or inflammation. Spleen: Normal in size without focal abnormality. Adrenals/Urinary Tract: No adrenal nodule. No hydronephrosis or renal calculi. Question of mild faint right perinephric edema. No evidence of focal renal abnormality on this unenhanced exam. No stones along the course of the ureters. Urinary bladder is essentially empty and not well assessed.  Stomach/Bowel: Bowel assessment is limited in the absence of contrast as well as paucity of intra-abdominal fat. Normal appendix is visualized, for example series 5, image 41. No bowel obstruction or inflammatory change. Vascular/Lymphatic: The unenhanced vasculature is unremarkable. No bulky adenopathy. Reproductive: The uterus is anteverted. Limited adnexal assessment. No obvious adnexal mass. Other: Small amount of free fluid in the pelvis, nonspecific. No upper abdominal ascites. No free air. No abdominal wall hernia. Musculoskeletal: There are no acute or suspicious osseous abnormalities. IMPRESSION: 1. Question of mild faint right perinephric edema, can be seen with urinary tract infection. Recommend correlation with urinalysis. 2. No renal stones or obstructive uropathy. 3. Normal appendix. Electronically Signed   By: Narda Rutherford M.D.   On: 01/08/2023 18:05    Procedures Procedures   Medications Ordered in ED Medications - No data to display  ED Course/ Medical Decision Making/ [name] Clinical  Course as of 01/08/23 2334  Fri Jan 08, 2023  1856 WBC, UA: 6-10 [OZ]    Clinical Course User Index [OZ] Luvenia Heller, PA-C                           Medical Decision Making Amount and/or Complexity of Data Reviewed Labs: ordered. Decision-making details documented in ED Course. Radiology: ordered.  Risk Prescription drug management.   This patient presents to the ED for concern of flank pain.  Differential diagnosis includes UTI, nephrolithiasis, STI, appendicitis    Lab Tests:  I Ordered, and personally interpreted labs.  The pertinent results include:  UA showing bacteria, but results confounded by AZO   Imaging Studies ordered:  I ordered imaging studies including CT abdomen  I independently visualized and interpreted imaging which showed no acute abnormalities, possible evidence of UTI I agree with the radiologist interpretation   Medicines ordered and prescription  drug management:  I have reviewed the patients home medicines and have made adjustments as needed   Problem List / ED Course:  Patient presented to the ED with concerns of flank pain. Patient initially expressed concerned for STI given painful sexual encounter, but on reevaluation she stated that she did not think STI was likely but more likely to be due to UTI as symptoms were consistent prior UTIs. With shared medical decision making, determined that treating for UTI was patient's preferred plan and would reassess STI with PCP if results were positive. Patient verbalized understanding all return precautions.   Final Clinical Impression(s) / ED Diagnoses Final diagnoses:  Acute cystitis without hematuria    Rx / DC Orders ED Discharge Orders          Ordered    cephALEXin (KEFLEX) 500 MG capsule  2 times daily        01/08/23 1901              Vladimir Creeks 01/08/23 2334    Drenda Freeze, MD 01/11/23 [phone]

## 2023-01-08 NOTE — ED Notes (Signed)
Discharge paperwork reviewed entirely with patient, including Rx's and follow up care. Pain was under control. Pt verbalized understanding as well as all parties involved. No questions or concerns voiced at the time of discharge. No acute distress noted.   Pt ambulated out to PVA without incident or assistance.

## 2023-01-08 NOTE — ED Triage Notes (Signed)
Patient presents to ED via POV from home. Here with flank pain and pelvic pain x 3 days. Requesting STD check. Expresses concern for UTI as well. Well appearing.

## 2023-01-08 NOTE — Discharge Instructions (Signed)
You were seen in the ED for a likely UTI. Unfortunately, the AZO you took made the urinalysis difficult to determine if you have a UTI, but your symptoms are consistent with a UTI so you will be treated as such. All other test results can be accessed via MyChart and if you need further treatment, you can reach out to your primary care provider regarding this.

## 2023-01-08 NOTE — ED Notes (Signed)
ED Provider at bedside. 

## 2023-01-09 LAB — HIV ANTIBODY (ROUTINE TESTING W REFLEX): HIV Screen 4th Generation wRfx: NONREACTIVE

## 2023-01-11 LAB — GC/CHLAMYDIA PROBE AMP (~~LOC~~) NOT AT ARMC
Chlamydia: NEGATIVE
Comment: NEGATIVE
Comment: NORMAL
Neisseria Gonorrhea: NEGATIVE

## 2023-01-27 ENCOUNTER — Other Ambulatory Visit (HOSPITAL_BASED_OUTPATIENT_CLINIC_OR_DEPARTMENT_OTHER): Payer: Self-pay

## 2023-02-02 ENCOUNTER — Ambulatory Visit (INDEPENDENT_AMBULATORY_CARE_PROVIDER_SITE_OTHER): Payer: Medicaid Other

## 2023-02-02 VITALS — BP 120/79 | HR 68 | Ht 71.0 in | Wt 122.0 lb

## 2023-02-02 DIAGNOSIS — Z0130 Encounter for examination of blood pressure without abnormal findings: Secondary | ICD-10-CM

## 2023-02-02 NOTE — Progress Notes (Signed)
Subjective:  Dawn Graves is a 27 y.o. female here for BP check.   Hypertension ROS: taking medications as instructed and no medication side effects noted.    Objective:  LMP 12/26/2022 (Exact Date)   Appearance alert, well appearing, and in no distress. General exam BP noted to be well controlled today in office.  Patient was asked to come for BP check after one month of birth control use.  Assessment:   Blood Pressure well controlled.   Plan:  Current treatment plan is effective, no change in therapy. Kathrene Alu RN

## 2023-02-22 ENCOUNTER — Other Ambulatory Visit: Payer: Self-pay

## 2023-02-22 ENCOUNTER — Other Ambulatory Visit (HOSPITAL_BASED_OUTPATIENT_CLINIC_OR_DEPARTMENT_OTHER): Payer: Self-pay

## 2023-02-23 ENCOUNTER — Encounter: Payer: Self-pay | Admitting: General Practice

## 2023-03-18 ENCOUNTER — Other Ambulatory Visit (HOSPITAL_BASED_OUTPATIENT_CLINIC_OR_DEPARTMENT_OTHER): Payer: Self-pay

## 2023-03-18 ENCOUNTER — Other Ambulatory Visit: Payer: Self-pay

## 2023-03-18 DIAGNOSIS — Z3040 Encounter for surveillance of contraceptives, unspecified: Secondary | ICD-10-CM

## 2023-03-18 MED ORDER — NORETHINDRONE-ETH ESTRADIOL 0.4-35 MG-MCG PO TABS
1.0000 | ORAL_TABLET | Freq: Every day | ORAL | 11 refills | Status: DC
  Filled 2023-03-18: qty 28, 28d supply, fill #0
  Filled 2023-04-13: qty 28, 28d supply, fill #1
  Filled 2023-05-05: qty 28, 28d supply, fill #2
  Filled ????-??-??: fill #2

## 2023-03-18 NOTE — Progress Notes (Signed)
error 

## 2023-03-18 NOTE — Progress Notes (Signed)
Patient called requesting a refill of birth control pills. A refill of norethindrone-ethinyl estradiol 1 tablet po daily was sent to her pharmacy. Understanding was voiced. Zamia Tyminski l Elgie Maziarz, CMA

## 2023-03-19 ENCOUNTER — Other Ambulatory Visit (HOSPITAL_BASED_OUTPATIENT_CLINIC_OR_DEPARTMENT_OTHER): Payer: Self-pay

## 2023-03-22 ENCOUNTER — Other Ambulatory Visit (HOSPITAL_BASED_OUTPATIENT_CLINIC_OR_DEPARTMENT_OTHER): Payer: Self-pay

## 2023-04-13 ENCOUNTER — Other Ambulatory Visit (HOSPITAL_BASED_OUTPATIENT_CLINIC_OR_DEPARTMENT_OTHER): Payer: Self-pay

## 2023-05-06 ENCOUNTER — Other Ambulatory Visit (HOSPITAL_BASED_OUTPATIENT_CLINIC_OR_DEPARTMENT_OTHER): Payer: Self-pay

## 2023-05-07 ENCOUNTER — Other Ambulatory Visit (HOSPITAL_BASED_OUTPATIENT_CLINIC_OR_DEPARTMENT_OTHER): Payer: Self-pay

## 2023-05-07 ENCOUNTER — Other Ambulatory Visit: Payer: Self-pay | Admitting: Family Medicine

## 2023-05-07 DIAGNOSIS — Z3040 Encounter for surveillance of contraceptives, unspecified: Secondary | ICD-10-CM

## 2023-05-07 MED ORDER — NORETHINDRONE-ETH ESTRADIOL 0.4-35 MG-MCG PO TABS
1.0000 | ORAL_TABLET | Freq: Every day | ORAL | 3 refills | Status: DC
  Filled 2023-05-07: qty 84, 84d supply, fill #0
  Filled 2023-05-07: qty 84, 63d supply, fill #0
  Filled 2023-07-12: qty 84, 63d supply, fill #1
  Filled 2023-09-08: qty 84, 63d supply, fill #2
  Filled 2023-11-10 – 2023-11-23 (×2): qty 84, 63d supply, fill #3

## 2023-05-11 ENCOUNTER — Other Ambulatory Visit (HOSPITAL_BASED_OUTPATIENT_CLINIC_OR_DEPARTMENT_OTHER): Payer: Self-pay

## 2023-07-12 ENCOUNTER — Other Ambulatory Visit (HOSPITAL_BASED_OUTPATIENT_CLINIC_OR_DEPARTMENT_OTHER): Payer: Self-pay

## 2023-07-13 ENCOUNTER — Other Ambulatory Visit (HOSPITAL_BASED_OUTPATIENT_CLINIC_OR_DEPARTMENT_OTHER): Payer: Self-pay

## 2023-07-14 ENCOUNTER — Ambulatory Visit
Admission: RE | Admit: 2023-07-14 | Discharge: 2023-07-14 | Disposition: A | Discharge status: Home / Self Care | Payer: Medicaid Other | Source: Ambulatory Visit | Attending: Physician Assistant | Admitting: Physician Assistant

## 2023-07-14 VITALS — BP 105/75 | HR 83 | Temp 98.6°F | Resp 16

## 2023-07-14 DIAGNOSIS — R0981 Nasal congestion: Secondary | ICD-10-CM

## 2023-07-14 DIAGNOSIS — J069 Acute upper respiratory infection, unspecified: Secondary | ICD-10-CM | POA: Diagnosis not present

## 2023-07-14 MED ORDER — CETIRIZINE HCL 10 MG PO TABS: 10.0000 mg | ORAL_TABLET | Freq: Every day | ORAL | 0 refills | Status: DC

## 2023-07-14 MED ORDER — FLUTICASONE PROPIONATE 50 MCG/ACT NA SUSP: 1.0000 | Freq: Every day | NASAL | 0 refills | Status: DC

## 2023-07-14 NOTE — ED Provider Notes (Signed)
[facility]    CSN: [id number] Arrival date & time: 07/14/23  1633      History   Chief Complaint Chief Complaint  Patient presents with   Ear Drainage    My child was recently diagnosed with an ear infection so to be safe I'd like to get myself checked out. - Entered by patient    HPI Dawn Graves is a 27 y.o. female.   Patient presents today with a 6-day history of URI symptoms.  She reports nasal congestion, sinus pressure, headache, sore throat.  Most of the symptoms have improved but she continues to have some nasal congestion.  She is feeling overall better but wanted to be checked out as she took her daughter to the pediatrician after she had similar symptoms and was diagnosed with an ear infection.  She denies any otalgia.  She has not been taking any over-the-counter medication for symptom management.  Denies history of allergies, asthma, COPD, smoking.  She has no concern for pregnancy.  Denies any recent antibiotics or steroids.  She has not had COVID in the past.  She has not had COVID-19 vaccines.    Past Medical History:  Diagnosis Date   Immunization, varicella    Lab confirmed immunity on 07/02/2016, results sent for scanning   Medical history non-contributory     Patient Active Problem List   Diagnosis Date Noted   Initiation of oral contraception 01/06/2023   Chorioamnionitis in third trimester, fetus 1 03/21/2022   Vacuum extractor delivery, delivered 03/21/2022   Labor and delivery indication for care or intervention 03/18/2022   Low amniotic fluid, third trimester, fetus 1 02/24/2022   Encounter for supervision of normal first pregnancy in second trimester 09/23/2021   Late prenatal care affecting pregnancy in second trimester 09/23/2021    Past Surgical History:  Procedure Laterality Date   NO PAST SURGERIES      OB History     Gravida  1   Para  1   Term  1   Preterm      AB      Living  1      SAB      IAB       Ectopic      Multiple  0   Live Births  1            Home Medications    Prior to Admission medications   Medication Sig Start Date End Date Taking? Authorizing Provider  cetirizine (ZYRTEC ALLERGY) 10 MG tablet Take 1 tablet (10 mg total) by mouth at bedtime. 07/14/23  Yes Praveen Coia K, PA-C  fluticasone (FLONASE) 50 MCG/ACT nasal spray Place 1 spray into both nostrils daily. 07/14/23  Yes Nollie Shiflett, Noberto Retort, PA-C  norethindrone-ethinyl estradiol (OVCON-35) 0.4-35 MG-MCG tablet Take 1 tablet by mouth daily. Skip non-hormone days and start a new pack to allow for continuous suppression. 05/07/23   Levie Heritage, DO    Family History Family History  Problem Relation Age of Onset   Hypertension Maternal Grandmother    Diabetes Maternal Grandmother    Hypertension Paternal Grandmother    Diabetes Paternal Grandmother    Sudden death Neg Hx    Heart attack Neg Hx    Cancer Neg Hx     Social History Social History   Tobacco Use   Smoking status: Never   Smokeless tobacco: Never  Vaping Use   Vaping status: Former   Substances: THC  Substance Use  Topics   Alcohol use: Yes    Comment: occasionally   Drug use: No     Allergies   Strawberry extract   Review of Systems Review of Systems  Constitutional:  Positive for activity change. Negative for appetite change, fatigue and fever.  HENT:  Positive for congestion, postnasal drip and sinus pressure. Negative for sneezing and sore throat.   Respiratory:  Negative for cough and shortness of breath.   Cardiovascular:  Negative for chest pain.  Gastrointestinal:  Negative for abdominal pain, diarrhea, nausea and vomiting.  Neurological:  Negative for dizziness, light-headedness and headaches.     Physical Exam Triage Vital Signs ED Triage Vitals  Encounter Vitals Group     BP 07/14/23 1642 105/75     Systolic BP Percentile --      Diastolic BP Percentile --      Pulse Rate 07/14/23 1642 83     Resp 07/14/23 1642  16     Temp 07/14/23 1642 98.6 F (37 C)     Temp Source 07/14/23 1642 Oral     SpO2 07/14/23 1642 98 %     Weight --      Height --      Head Circumference --      Peak Flow --      Pain Score 07/14/23 1645 0     Pain Loc --      Pain Education --      Exclude from Growth Chart --    No data found.  Updated Vital Signs BP 105/75 (BP Location: Right Arm)   Pulse 83   Temp 98.6 F (37 C) (Oral)   Resp 16   SpO2 98%   Visual Acuity Right Eye Distance:   Left Eye Distance:   Bilateral Distance:    Right Eye Near:   Left Eye Near:    Bilateral Near:     Physical Exam Vitals reviewed.  Constitutional:      General: She is awake. She is not in acute distress.    Appearance: Normal appearance. She is well-developed. She is not ill-appearing.     Comments: Very pleasant female appears stated age in no acute distress sitting comfortably in exam room  HENT:     Head: Normocephalic and atraumatic.     Right Ear: Tympanic membrane, ear canal and external ear normal. Tympanic membrane is not erythematous or bulging.     Left Ear: Tympanic membrane, ear canal and external ear normal. Tympanic membrane is not erythematous or bulging.     Nose:     Right Sinus: No maxillary sinus tenderness or frontal sinus tenderness.     Left Sinus: No maxillary sinus tenderness or frontal sinus tenderness.     Mouth/Throat:     Pharynx: Uvula midline. Postnasal drip present. No oropharyngeal exudate or posterior oropharyngeal erythema.  Cardiovascular:     Rate and Rhythm: Normal rate and regular rhythm.     Heart sounds: Normal heart sounds, S1 normal and S2 normal. No murmur heard. Pulmonary:     Effort: Pulmonary effort is normal.     Breath sounds: Normal breath sounds. No wheezing, rhonchi or rales.     Comments: Clear to auscultation bilaterally Psychiatric:        Behavior: Behavior is cooperative.      UC Treatments / Results  Labs (all labs ordered are listed, but only  abnormal results are displayed) Labs Reviewed - No data to display  EKG   Radiology  No results found.  Procedures Procedures (including critical care time)  Medications Ordered in UC Medications - No data to display  Initial Impression / Assessment and Plan / UC Course  I have reviewed the triage vital signs and the nursing notes.  Pertinent labs & imaging results that were available during my care of the patient were reviewed by me and considered in my medical decision making (see chart for details).     Patient is well-appearing, afebrile, nontoxic, nontachycardic.  No evidence of acute infection on physical exam that would warrant initiation of antibiotics.  No indication for viral testing as patient has been symptomatic for more than 5 days and this would not change management.  Discussed likely viral etiology of symptoms.  Encouraged to use over-the-counter medications for symptomatic management and prescription for cetirizine and fluticasone nasal spray was sent to pharmacy.  She was encouraged to use nasal saline and sinus rinses for additional symptom relief.  Discussed that if her symptoms or not improving within a week she should return for reevaluation.  If she has any worsening symptoms including fever, nausea, vomiting, weakness she needs to be seen emergently.  Strict return precautions given.  Work excuse note provided.  Final Clinical Impressions(s) / UC Diagnoses   Final diagnoses:  Upper respiratory tract infection, unspecified type  Nasal congestion     Discharge Instructions      I would that you have a virus.  There is no evidence of infection that would warrant initiation of antibiotics today.  Start cetirizine at night and fluticasone nasal spray during the day to help manage her congestion symptoms.  I also recommend nasal saline/sinus rinses as well as drinking plenty of fluid.  If your symptoms or not improving within a week please return for reevaluation.   If anything worsens and you have high fever, worsening congestion, severe cough, shortness of breath, chest pain you should be seen immediately.     ED Prescriptions     Medication Sig Dispense Auth. Provider   fluticasone (FLONASE) 50 MCG/ACT nasal spray Place 1 spray into both nostrils daily. 16 g Shailynn Fong K, PA-C   cetirizine (ZYRTEC ALLERGY) 10 MG tablet Take 1 tablet (10 mg total) by mouth at bedtime. 30 tablet Angelisse Riso, Noberto Retort, PA-C      PDMP not reviewed this encounter.   Jeani Hawking, PA-C 07/14/23 1715

## 2023-07-14 NOTE — Discharge Instructions (Signed)
I would that you have a virus.  There is no evidence of infection that would warrant initiation of antibiotics today.  Start cetirizine at night and fluticasone nasal spray during the day to help manage her congestion symptoms.  I also recommend nasal saline/sinus rinses as well as drinking plenty of fluid.  If your symptoms or not improving within a week please return for reevaluation.  If anything worsens and you have high fever, worsening congestion, severe cough, shortness of breath, chest pain you should be seen immediately.

## 2023-07-14 NOTE — ED Triage Notes (Signed)
Pt presents to UC w/ c/o sinus issues, headache x5 days. Had a sore throat and mucus for 1 day. Daughter recently had an ear infection.

## 2023-09-03 ENCOUNTER — Ambulatory Visit: Payer: Managed Care, Other (non HMO)

## 2023-09-06 ENCOUNTER — Ambulatory Visit (INDEPENDENT_AMBULATORY_CARE_PROVIDER_SITE_OTHER): Payer: Managed Care, Other (non HMO)

## 2023-09-06 ENCOUNTER — Other Ambulatory Visit (HOSPITAL_COMMUNITY)
Admission: RE | Admit: 2023-09-06 | Discharge: 2023-09-06 | Disposition: A | Discharge status: Home / Self Care | Payer: Managed Care, Other (non HMO) | Source: Ambulatory Visit | Attending: Family Medicine | Admitting: Family Medicine

## 2023-09-06 VITALS — BP 117/81 | HR 59 | Wt 128.0 lb

## 2023-09-06 DIAGNOSIS — Z113 Encounter for screening for infections with a predominantly sexual mode of transmission: Secondary | ICD-10-CM | POA: Diagnosis not present

## 2023-09-06 NOTE — Progress Notes (Signed)
Patient presents for STD screening. Self swab was sent to the lab. Dawn Graves l Dawn Graves, CMA

## 2023-09-08 ENCOUNTER — Other Ambulatory Visit: Payer: Self-pay

## 2023-09-08 LAB — CERVICOVAGINAL ANCILLARY ONLY
Chlamydia: NEGATIVE
Comment: NEGATIVE
Comment: NEGATIVE
Comment: NORMAL
Neisseria Gonorrhea: NEGATIVE
Trichomonas: NEGATIVE

## 2023-09-15 ENCOUNTER — Ambulatory Visit: Payer: Managed Care, Other (non HMO) | Admitting: Family Medicine

## 2023-09-15 ENCOUNTER — Encounter: Payer: Self-pay | Admitting: Family Medicine

## 2023-09-17 ENCOUNTER — Ambulatory Visit (INDEPENDENT_AMBULATORY_CARE_PROVIDER_SITE_OTHER): Payer: Managed Care, Other (non HMO) | Admitting: Family Medicine

## 2023-09-17 ENCOUNTER — Encounter: Payer: Self-pay | Admitting: Family Medicine

## 2023-09-17 VITALS — BP 107/73 | HR 61 | Temp 97.7°F | Resp 18 | Ht 71.0 in | Wt 130.0 lb

## 2023-09-17 DIAGNOSIS — F419 Anxiety disorder, unspecified: Secondary | ICD-10-CM | POA: Diagnosis not present

## 2023-09-17 DIAGNOSIS — Z7689 Persons encountering health services in other specified circumstances: Secondary | ICD-10-CM | POA: Diagnosis not present

## 2023-09-17 DIAGNOSIS — R4586 Emotional lability: Secondary | ICD-10-CM | POA: Diagnosis not present

## 2023-09-17 NOTE — Progress Notes (Signed)
New Patient Office Visit  Subjective    Patient ID: Dawn Graves, female    DOB: 1996/08/10  Age: 27 y.o. MRN: [id number]  CC:  Chief Complaint  Patient presents with   Establish Care    Patient is here to establish care with new PCP, Patient is states that she is wanting to discuss her overall health and mental health    HPI Dawn Graves presents to establish care  Mood changes Pt is here for concerns of mood changes and irritability. She also reports some anxiousness. She has family hx of mental health disorders with depression and OCD in her father. She would like to pursue assessments to make sure she has no underlying mental health condition. 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 This adds stressors to her life. She does report at the age of 27 y.o, she had hx of depression. She has been experiencing panic attacks lately. She has been on birth control since February 2024. Reports she took a break from this and restarted the OCP in August 2024.   Flowsheet Row Office Visit from 09/17/2023 in [facility]  PHQ-9 Total Score 1          09/17/2023   11:13 AM 02/17/2022   10:50 AM 12/16/2021   10:41 AM 09/23/2021    9:53 AM  GAD 7 : Generalized Anxiety Score  Nervous, Anxious, on Edge 1 1 0 1  Control/stop worrying 0 0 0 0  Worry too much - different things 1 0 0 0  Trouble relaxing 1 0 0 0  Restless 0 0 0 0  Easily annoyed or irritable 1 1 2 1   Afraid - awful might happen 0 0 0 0  Total GAD 7 Score 4 2 2 2   Anxiety Difficulty Somewhat difficult   Not difficult at all      Outpatient Encounter Medications as of 09/17/2023  Medication Sig   norethindrone-ethinyl estradiol (OVCON-35) 0.4-35 MG-MCG tablet Take 1 tablet by mouth daily. Skip non-hormone days and start a new pack to allow for continuous suppression.   No facility-administered encounter medications on file as of 09/17/2023.    Past  Medical History:  Diagnosis Date   Allergy    typical seasonal allergies, minior food allegeries, and dermatitis.   Immunization, varicella    Lab confirmed immunity on 07/02/2016, results sent for scanning   Medical history non-contributory     Past Surgical History:  Procedure Laterality Date   NO PAST SURGERIES      Family History  Problem Relation Age of Onset   Hypertension Maternal Grandmother    Diabetes Maternal Grandmother    Hypertension Paternal Grandmother    Diabetes Paternal Grandmother    Drug abuse Paternal Grandmother    Anxiety disorder Father    Depression Father    Sudden death Neg Hx    Heart attack Neg Hx    Cancer Neg Hx     Social History   Socioeconomic History   Marital status: Single    Spouse name: Not on file   Number of children: Not on file   Years of education: Not on file   Highest education level: Bachelor's degree (e.g., BA, AB, BS)  Occupational History   Not on file  Tobacco Use   Smoking status: Never    Passive exposure: Never   Smokeless tobacco: Never  Vaping Use   Vaping status: Former  Substances: THC  Substance and Sexual Activity   Alcohol use: Yes    Comment: socially   Drug use: No   Sexual activity: Yes    Birth control/protection: Condom, Pill  Other Topics Concern   Not on file  Social History Narrative   Not on file   Social Determinants of Health   Financial Resource Strain: Low Risk  (09/12/2023)   Overall Financial Resource Strain (CARDIA)    Difficulty of Paying Living Expenses: Not very hard  Food Insecurity: No Food Insecurity (09/12/2023)   Hunger Vital Sign    Worried About Running Out of Food in the Last Year: Never true    Ran Out of Food in the Last Year: Never true  Transportation Needs: No Transportation Needs (09/12/2023)   PRAPARE - Administrator, Civil Service (Medical): No    Lack of Transportation (Non-Medical): No  Physical Activity: Sufficiently Active (09/12/2023)    Exercise Vital Sign    Days of Exercise per Week: 7 days    Minutes of Exercise per Session: 90 min  Stress: No Stress Concern Present (09/12/2023)   [employer] of Occupational Health - Occupational Stress Questionnaire    Feeling of Stress : Not at all  Social Connections: Socially Isolated (09/12/2023)   Social Connection and Isolation Panel [NHANES]    Frequency of Communication with Friends and Family: More than three times a week    Frequency of Social Gatherings with Friends and Family: Once a week    Attends Religious Services: Never    Database administrator or Organizations: No    Attends Engineer, structural: Not on file    Marital Status: Never married  Intimate Partner Violence: Not on file    Review of Systems  Psychiatric/Behavioral:  Negative for depression. The patient is nervous/anxious.   All other systems reviewed and are negative.      Objective    BP 107/73   Pulse 61   Temp 97.7 F (36.5 C) (Oral)   Resp 18   Ht 5\' 11"  (1.803 m)   Wt 130 lb (59 kg)   SpO2 98%   BMI 18.13 kg/m   Physical Exam Vitals and nursing note reviewed.  Constitutional:      Appearance: Normal appearance. She is normal weight.  HENT:     Head: Normocephalic and atraumatic.     Right Ear: External ear normal.     Left Ear: External ear normal.     Nose: Nose normal.     Mouth/Throat:     Mouth: Mucous membranes are moist.     Pharynx: Oropharynx is clear.  Eyes:     Conjunctiva/sclera: Conjunctivae normal.     Pupils: Pupils are equal, round, and reactive to light.  Cardiovascular:     Rate and Rhythm: Normal rate and regular rhythm.     Pulses: Normal pulses.     Heart sounds: Normal heart sounds.  Pulmonary:     Effort: Pulmonary effort is normal.     Breath sounds: Normal breath sounds.  Skin:    General: Skin is warm.     Capillary Refill: Capillary refill takes less than 2 seconds.  Neurological:     General: No focal deficit present.      Mental Status: She is alert and oriented to person, place, and time. Mental status is at baseline.  Psychiatric:        Mood and Affect: Mood normal.  Behavior: Behavior normal.        Thought Content: Thought content normal.        Judgment: Judgment normal.       Assessment & Plan:   Problem List Items Addressed This Visit   None Visit Diagnoses     Encounter to establish care with new doctor    -  Primary   Mood changes       Relevant Orders   Ambulatory referral to Behavioral Health   TSH + free T4   Anxiousness       Relevant Orders   Ambulatory referral to Behavioral Health   TSH + free T4      Encounter to establish care with new doctor  Mood changes -     Ambulatory referral to Behavioral Health -     TSH + free T4  Anxiousness -     Ambulatory referral to Behavioral Health -     TSH + free T4  Will refer to [facility] for further evaluation and treatment options To screen thyroid function See back in 3 months for CPE. Return in about 3 months (around 12/18/2023) for Annual Physical.   Suzan Slick, MD

## 2023-09-30 ENCOUNTER — Ambulatory Visit
Admission: RE | Admit: 2023-09-30 | Discharge: 2023-09-30 | Disposition: A | Discharge status: Home / Self Care | Payer: Managed Care, Other (non HMO) | Source: Ambulatory Visit | Attending: Emergency Medicine | Admitting: Emergency Medicine

## 2023-09-30 ENCOUNTER — Other Ambulatory Visit (HOSPITAL_BASED_OUTPATIENT_CLINIC_OR_DEPARTMENT_OTHER): Payer: Self-pay

## 2023-09-30 VITALS — BP 109/78 | HR 82 | Temp 98.8°F | Resp 18

## 2023-09-30 DIAGNOSIS — B9689 Other specified bacterial agents as the cause of diseases classified elsewhere: Secondary | ICD-10-CM | POA: Insufficient documentation

## 2023-09-30 DIAGNOSIS — J028 Acute pharyngitis due to other specified organisms: Secondary | ICD-10-CM | POA: Insufficient documentation

## 2023-09-30 LAB — POCT RAPID STREP A (OFFICE): Rapid Strep A Screen: NEGATIVE

## 2023-09-30 MED ORDER — AMOXICILLIN 500 MG PO CAPS
500.0000 mg | ORAL_CAPSULE | Freq: Two times a day (BID) | ORAL | 0 refills | Status: AC
Start: 2023-09-30 — End: 2023-10-10
  Filled 2023-09-30: qty 20, 10d supply, fill #0

## 2023-09-30 NOTE — ED Provider Notes (Signed)
[facility]    CSN: [id number] Arrival date & time: 09/30/23  0865    HISTORY   Chief Complaint  Patient presents with   Sore Throat    Having some throat pain not seeing much swelling or redness but it definitely hurts to swallow. - Entered by patient   HPI Dawn Graves is a pleasant, 27 y.o. female who presents to urgent care today. Patient complains of a mild sore throat over the past week that became significantly worse overnight.  Patient states it is now very painful to swallow, has some postnasal drip as well as some slight sinus pressure.  Patient also reports tenderness of the glands underneath her chin.  Patient states she has been drinking a Mucinex tea without relief.  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  States daughter finished a 10-day course of antibiotics for an ear infection yesterday.  Also reports that many of her students had been coming to school sick.  Patient denies fever, body aches, chills, loss of taste or smell, cough, otalgia, headache, nausea, difficulty managing secretions, difficulty maintaining airway.  Patient endorses some loss of appetite.  The history is provided by the patient.   Past Medical History:  Diagnosis Date   Allergy    typical seasonal allergies, minior food allegeries, and dermatitis.   Immunization, varicella    Lab confirmed immunity on 07/02/2016, results sent for scanning   Medical history non-contributory    Patient Active Problem List   Diagnosis Date Noted   Initiation of oral contraception 01/06/2023   Chorioamnionitis in third trimester, fetus 1 03/21/2022   Vacuum extractor delivery, delivered 03/21/2022   Labor and delivery indication for care or intervention 03/18/2022   Low amniotic fluid, third trimester, fetus 1 02/24/2022   Encounter for supervision of normal first pregnancy in second trimester 09/23/2021   Late prenatal care  affecting pregnancy in second trimester 09/23/2021   Past Surgical History:  Procedure Laterality Date   NO PAST SURGERIES     OB History     Gravida  1   Para  1   Term  1   Preterm      AB      Living  1      SAB      IAB      Ectopic      Multiple  0   Live Births  1          Home Medications    Prior to Admission medications   Medication Sig Start Date End Date Taking? Authorizing Provider  amoxicillin (AMOXIL) 500 MG capsule Take 1 capsule (500 mg total) by mouth 2 (two) times daily for 10 days. 09/30/23 10/10/23 Yes Theadora Rama Scales, PA-C  norethindrone-ethinyl estradiol (OVCON-35) 0.4-35 MG-MCG tablet Take 1 tablet by mouth daily. Skip non-hormone days and start a new pack to allow for continuous suppression. 05/07/23   Levie Heritage, DO    Family History Family History  Problem Relation Age of Onset   Hypertension Maternal Grandmother    Diabetes Maternal Grandmother    Hypertension Paternal Grandmother    Diabetes Paternal Grandmother    Drug abuse Paternal Grandmother    Anxiety disorder Father    Depression Father    Sudden death Neg Hx    Heart attack Neg Hx    Cancer Neg Hx    Social History Social History   Tobacco Use  Smoking status: Never    Passive exposure: Never   Smokeless tobacco: Never  Vaping Use   Vaping status: Former   Substances: THC  Substance Use Topics   Alcohol use: Yes    Comment: socially   Drug use: No   Allergies   Strawberry extract and Wound dressing adhesive  Review of Systems Review of Systems Pertinent findings revealed after performing a 14 point review of systems has been noted in the history of present illness.  Physical Exam Vital Signs BP 109/78 (BP Location: Right Arm)   Pulse 82   Temp 98.8 F (37.1 C) (Oral)   Resp 18   LMP 08/21/2023   SpO2 98%   Breastfeeding No   No data found.  Physical Exam Vitals and nursing note reviewed.  Constitutional:      General: She  is awake. She is not in acute distress.    Appearance: She is well-developed, well-groomed and normal weight. She is not ill-appearing or toxic-appearing.  HENT:     Head: Normocephalic and atraumatic.     Salivary Glands: Right salivary gland is diffusely enlarged and tender. Left salivary gland is diffusely enlarged and tender.     Right Ear: Hearing, tympanic membrane and external ear normal.     Left Ear: Hearing, tympanic membrane and external ear normal.     Ears:     Comments: Bilateral EACs with mild erythema, bilateral TMs are normal    Nose: No mucosal edema, congestion or rhinorrhea.     Right Turbinates: Not enlarged, swollen or pale.     Left Turbinates: Not enlarged or swollen.     Right Sinus: No maxillary sinus tenderness or frontal sinus tenderness.     Left Sinus: No maxillary sinus tenderness or frontal sinus tenderness.     Mouth/Throat:     Lips: Pink. No lesions.     Mouth: Mucous membranes are moist. No oral lesions or angioedema.     Dentition: No gingival swelling.     Tongue: No lesions.     Palate: No mass.     Pharynx: Uvula midline. Pharyngeal swelling, oropharyngeal exudate and posterior oropharyngeal erythema present. No uvula swelling.     Tonsils: Tonsillar exudate present. 2+ on the right. 2+ on the left.   Eyes:     Extraocular Movements: Extraocular movements intact.     Conjunctiva/sclera: Conjunctivae normal.     Pupils: Pupils are equal, round, and reactive to light.  Neck:     Thyroid: No thyroid mass, thyromegaly or thyroid tenderness.     Trachea: Tracheal tenderness present. No abnormal tracheal secretions or tracheal deviation.     Comments: Voice is muffled Cardiovascular:     Rate and Rhythm: Normal rate and regular rhythm.     Pulses: Normal pulses.     Heart sounds: Normal heart sounds, S1 normal and S2 normal. No murmur heard.    No friction rub. No gallop.  Pulmonary:     Effort: Pulmonary effort is normal. No accessory muscle  usage, prolonged expiration, respiratory distress or retractions.     Breath sounds: No stridor, decreased air movement or transmitted upper airway sounds. No decreased breath sounds, wheezing, rhonchi or rales.  Abdominal:     General: Bowel sounds are normal.     Palpations: Abdomen is soft.     Tenderness: There is generalized abdominal tenderness. There is no right CVA tenderness, left CVA tenderness or rebound. Negative signs include Murphy's sign.     Hernia:  No hernia is present.  Musculoskeletal:        General: No tenderness. Normal range of motion.     Cervical back: Full passive range of motion without pain, normal range of motion and neck supple.     Right lower leg: No edema.     Left lower leg: No edema.  Lymphadenopathy:     Cervical: Cervical adenopathy present.     Right cervical: Superficial cervical adenopathy present.     Left cervical: Superficial cervical adenopathy present.  Skin:    General: Skin is warm and dry.     Findings: No erythema, lesion or rash.  Neurological:     General: No focal deficit present.     Mental Status: She is alert and oriented to person, place, and time. Mental status is at baseline.  Psychiatric:        Mood and Affect: Mood normal.        Behavior: Behavior normal. Behavior is cooperative.        Thought Content: Thought content normal.        Judgment: Judgment normal.     Visual Acuity Right Eye Distance:   Left Eye Distance:   Bilateral Distance:    Right Eye Near:   Left Eye Near:    Bilateral Near:     UC Couse / Diagnostics / Procedures:     Radiology No results found.  Procedures Procedures (including critical care time) EKG  Pending results:  Labs Reviewed  CULTURE, GROUP A STREP [facility])  POCT RAPID STREP A (OFFICE)    Medications Ordered in UC: Medications - No data to display  UC Diagnoses / Final Clinical Impressions(s)   I have reviewed the triage vital signs and the nursing notes.  Pertinent  labs & imaging results that were available during my care of the patient were reviewed by me and considered in my medical decision making (see chart for details).    Final diagnoses:  Acute bacterial pharyngitis   Patient advised of negative rapid strep test.  We will perform throat culture to confirm.  Based on physical exam findings, I believe the patient does have a bacterial pharyngitis whether it is strep or not.  Patient has been advised to complete the full 10-day course of amoxicillin regardless of the strep throat culture result.  Conservative care recommended, supportive medications discussed.  Return precautions advised.  Please see discharge instructions below for details of plan of care as provided to patient. ED Prescriptions     Medication Sig Dispense Auth. Provider   amoxicillin (AMOXIL) 500 MG capsule Take 1 capsule (500 mg total) by mouth 2 (two) times daily for 10 days. 20 capsule Theadora Rama Scales, PA-C      PDMP not reviewed this encounter.  Pending results:  Labs Reviewed  CULTURE, GROUP A STREP [facility])  POCT RAPID STREP A (OFFICE)    Discharge Instructions:   Discharge Instructions      Based on physical exam findings today, believe that you are suffering from a bacterial infection in your throat.  I recommend that you begin amoxicillin for presumed streptococcal pharyngitis instead of waiting 2 to 3 days for the results of your throat culture.  Keep in mind that there are other kinds of bacteria that can cause acute infection in the throat so if you have significant relief of your throat pain after 24 hours of taking amoxicillin but receive a phone call in 2 to 3 days that your throat culture was  negative, I recommend that you still finish the full 10-day course to complete your treatment of non-streptococcal bacterial infection.  As we discussed, the prescription for amoxicillin will say to take 1 capsule twice daily but you are also welcome to take both  capsules at the same time.  If you plan to go back to school tomorrow, I recommend that you take 2 capsules once daily and take it as soon as you pick it up from your pharmacy.  After 24 hours of being on antibiotics, please throw away your current toothbrush and begin using a brand new one so that you do not recontaminate yourself.  Boiling your toothbrush or running it through the dishwasher will not completely eradicate all bacteria.  You are welcome to take ibuprofen 400 mg to 600 mg every 6-8 hours as needed for throat pain.  You may also find that gargling with warm salt water (1 teaspoon of salt mixed into 4 ounces of warm water) for 20 to 30 seconds several times a day is also helpful.  Throat numbing sprays also have their place in pain relief and you are welcome to try them.  If you are not feeling better in the next 3 to 5 days or if you begin to feel worse in the next 3-5 days, you are welcome to return here for repeat evaluation or follow-up with your primary care provider.    If you develop high fever, throat swelling, difficulty managing your saliva or difficulty maintaining your airway despite taking amoxicillin, you may require more expedient evaluation at the emergency room.  Thank you for visiting [facility] today.  We appreciate opportunity to participate in your care.    Thank you for your service in the education of our young people!  We love teachers!      Disposition Upon Discharge:  Condition: stable for discharge home  Patient presented with an acute illness with associated systemic symptoms and significant discomfort requiring urgent management. In my opinion, this is a condition that a prudent lay person (someone who possesses an average knowledge of health and medicine) may potentially expect to result in complications if not addressed urgently such as respiratory distress, impairment of bodily function or dysfunction of bodily organs.   Routine  symptom specific, illness specific and/or disease specific instructions were discussed with the patient and/or caregiver at length.   As such, the patient has been evaluated and assessed, work-up was performed and treatment was provided in alignment with urgent care protocols and evidence based medicine.  Patient/parent/caregiver has been advised that the patient may require follow up for further testing and treatment if the symptoms continue in spite of treatment, as clinically indicated and appropriate.  Patient/parent/caregiver has been advised to return to the [facility] or PCP if no better; to PCP or the Emergency Department if new signs and symptoms develop, or if the current signs or symptoms continue to change or worsen for further workup, evaluation and treatment as clinically indicated and appropriate  The patient will follow up with their current PCP if and as advised. If the patient does not currently have a PCP we will assist them in obtaining one.   The patient may need specialty follow up if the symptoms continue, in spite of conservative treatment and management, for further workup, evaluation, consultation and treatment as clinically indicated and appropriate.  Patient/parent/caregiver verbalized understanding and agreement of plan as discussed.  All questions were addressed during visit.  Please see discharge instructions below  for further details of plan.  This office note has been dictated using Teaching laboratory technician.  Unfortunately, this method of dictation can sometimes lead to typographical or grammatical errors.  I apologize for your inconvenience in advance if this occurs.  Please do not hesitate to reach out to me if clarification is needed.      Theadora Rama Scales, [place] 09/30/23 [phone]

## 2023-09-30 NOTE — ED Triage Notes (Signed)
Pt with sore throat starting over a week ago, worse today, painful swallowing, post nasal drip and slight sinus pressure. Home remdies: mucinex tea Denies: F/chills/body aches, cough

## 2023-09-30 NOTE — Discharge Instructions (Addendum)
Based on physical exam findings today, believe that you are suffering from a bacterial infection in your throat.  I recommend that you begin amoxicillin for presumed streptococcal pharyngitis instead of waiting 2 to 3 days for the results of your throat culture.  Keep in mind that there are other kinds of bacteria that can cause acute infection in the throat so if you have significant relief of your throat pain after 24 hours of taking amoxicillin but receive a phone call in 2 to 3 days that your throat culture was negative, I recommend that you still finish the full 10-day course to complete your treatment of non-streptococcal bacterial infection.  As we discussed, the prescription for amoxicillin will say to take 1 capsule twice daily but you are also welcome to take both capsules at the same time.  If you plan to go back to school tomorrow, I recommend that you take 2 capsules once daily and take it as soon as you pick it up from your pharmacy.  After 24 hours of being on antibiotics, please throw away your current toothbrush and begin using a brand new one so that you do not recontaminate yourself.  Boiling your toothbrush or running it through the dishwasher will not completely eradicate all bacteria.  You are welcome to take ibuprofen 400 mg to 600 mg every 6-8 hours as needed for throat pain.  You may also find that gargling with warm salt water (1 teaspoon of salt mixed into 4 ounces of warm water) for 20 to 30 seconds several times a day is also helpful.  Throat numbing sprays also have their place in pain relief and you are welcome to try them.  If you are not feeling better in the next 3 to 5 days or if you begin to feel worse in the next 3-5 days, you are welcome to return here for repeat evaluation or follow-up with your primary care provider.    If you develop high fever, throat swelling, difficulty managing your saliva or difficulty maintaining your airway despite taking amoxicillin, you  may require more expedient evaluation at the emergency room.  Thank you for visiting [facility] today.  We appreciate opportunity to participate in your care.    Thank you for your service in the education of our young people!  We love teachers!

## 2023-10-03 LAB — CULTURE, GROUP A STREP (THRC)

## 2023-11-09 ENCOUNTER — Ambulatory Visit: Payer: 59 | Admitting: Behavioral Health

## 2023-11-09 DIAGNOSIS — F418 Other specified anxiety disorders: Secondary | ICD-10-CM

## 2023-11-09 NOTE — Progress Notes (Signed)
[facility] Counselor Initial Adult Exam  Name: Dawn Graves Date: 11/09/2023 MRN: [id number] DOB: 1996-11-19 PCP: Suzan Slick, MD  Time spent: 60 min In person @ [facility] Office Time In: 10:00am Time Out: 11:00am  Guardian/Payee:  [employer]    Paperwork requested: No   Reason for Visit /Presenting Problem: Elevated anx/dep & stress due to PPD & early single motherhood invl'g Child Custody Filing in Oct 2024.  Mental Status Exam: Appearance:   Casual and Neat     Behavior:  Appropriate and Sharing  Motor:  Normal  Speech/Language:   Clear and Coherent  Affect:  Appropriate  Mood:  anxious and sad  Thought process:  normal  Thought content:    WNL  Sensory/Perceptual disturbances:    WNL  Orientation:  oriented to person, place, time/date, and situation  Attention:  Good  Concentration:  Good  Memory:  WNL  Fund of knowledge:   Good  Insight:    Good  Judgment:   Good  Impulse Control:  Good    Risk Assessment: Danger to Self:  No Self-injurious Behavior: No Danger to Others: No Duty to Warn:no Physical Aggression / Violence:No  Access to Firearms a concern: No  Gang Involvement:No  Patient / guardian was educated about steps to take if suicide or homicide risk level increases between visits: yes; appropriate to ICD process While future psychiatric events cannot be accurately predicted, the patient does not currently require acute inpatient psychiatric care and does not currently meet [facility] involuntary commitment criteria.  Substance Abuse History: Current substance abuse: No     Past Psychiatric History:   No previous psychological problems have been observed Outpatient Providers:A Broadus John, MD History of Psych Hospitalization: No  Psychological Testing:  NA    Abuse History:  Victim of: No.,  NA    Report needed: No. Victim of Neglect:No. Perpetrator of  NA   Witness / Exposure to Domestic Violence: No    Protective Services Involvement: No  Witness to [employer] Violence:  No   Family History:  Family History  Problem Relation Age of Onset   Hypertension Maternal Grandmother    Diabetes Maternal Grandmother    Hypertension Paternal Grandmother    Diabetes Paternal Grandmother    Drug abuse Paternal Grandmother    Anxiety disorder Father    Depression Father    Sudden death Neg Hx    Heart attack Neg Hx    Cancer Neg Hx     Living situation: the patient lives with their family  Sexual Orientation: Straight  Relationship Status: single  Name of spouse / other:  Pt's Dtr Zora's Father lives in [place] & relationship w/him is new to Pt If a parent, number of children / ages:19 mos old  Support Systems: friends Parents; Mom Rodney Booze & Dad 'LT' - Pt currently lives w/her Parents  Financial Stress:  Yes ; Pt has a home & lots of support, & stresses about providing for her young Dtr  Income/Employment/Disability: 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  Military Service: No   Educational History: Education: Engineer, maintenance (IT) from [employer]; Pt also attended [employer] for the early part of her Risk manager. She intends to pursue her Graduate degree @ [employer] in Sch Guid Cslg. She is a Sociology Major.   Religion/Sprituality/World View: Unk  Any cultural differences that may affect / interfere with treatment:  None noted today  Recreation/Hobbies: Pt  is a Single Parent w/sole custody of her 46 mos old Dtr Zora  Stressors: Copy difficulties   Loss of time for herself, although she tries to do self-care practices to keep her spirits up. Loss of freedom due to the responsibilities of a Toddler. Loss of independence as she is forced to live @ home again w/Parents, but appreciates her Parents' love & understanding.     Strengths: Supportive Relationships, Family, Friends, Hopefulness, Journalist, newspaper, and  Able to Communicate Effectively, responsible  Barriers:  None noted today   Legal History: Pending legal issue / charges: The patient has no significant history of legal issues. History of legal issue / charges:  NA  Medical History/Surgical History: reviewed Past Medical History:  Diagnosis Date   Allergy    typical seasonal allergies, minior food allegeries, and dermatitis.   Immunization, varicella    Lab confirmed immunity on 07/02/2016, results sent for scanning   Medical history non-contributory     Past Surgical History:  Procedure Laterality Date   NO PAST SURGERIES      Medications: Current Outpatient Medications  Medication Sig Dispense Refill   norethindrone-ethinyl estradiol (OVCON-35) 0.4-35 MG-MCG tablet Take 1 tablet by mouth daily. Skip non-hormone days and start a new pack to allow for continuous suppression. 84 tablet 3   No current facility-administered medications for this visit.    Allergies  Allergen Reactions   Strawberry Extract Other (See Comments)    unknown   Wound Dressing Adhesive Dermatitis, Itching and Rash    Diagnoses:  Depression with anxiety  Plan of Care: Clotiel will attend all sessions of psychotherapy as scheduled every 2-3 wks. She will use the suggestions provided to assist her over the Ali Molina as she is nervous about being around a big crowd. Devlin will complete the Core Values Assessment mailed today for [place] to discuss next session.   Target Date: 12/13/2023  Progress: 4  Frequency: Once every 2-3 wks  Modality: Claretta Fraise, LMFT

## 2023-11-09 NOTE — Progress Notes (Signed)
                Anastasya Jewell L [name], LMFT 

## 2023-11-10 ENCOUNTER — Other Ambulatory Visit (HOSPITAL_BASED_OUTPATIENT_CLINIC_OR_DEPARTMENT_OTHER): Payer: Self-pay

## 2023-11-10 ENCOUNTER — Other Ambulatory Visit: Payer: Self-pay

## 2023-11-12 ENCOUNTER — Other Ambulatory Visit (HOSPITAL_BASED_OUTPATIENT_CLINIC_OR_DEPARTMENT_OTHER): Payer: Self-pay

## 2023-11-15 ENCOUNTER — Encounter: Payer: Self-pay | Admitting: Advanced Practice Midwife

## 2023-11-22 ENCOUNTER — Other Ambulatory Visit (HOSPITAL_BASED_OUTPATIENT_CLINIC_OR_DEPARTMENT_OTHER): Payer: Self-pay

## 2023-11-23 ENCOUNTER — Other Ambulatory Visit: Payer: Self-pay

## 2023-11-29 ENCOUNTER — Ambulatory Visit: Payer: 59 | Admitting: Behavioral Health

## 2023-11-29 DIAGNOSIS — F418 Other specified anxiety disorders: Secondary | ICD-10-CM | POA: Diagnosis not present

## 2023-11-29 NOTE — Progress Notes (Signed)
[facility] Counselor/Therapist Progress Note  Patient ID: CHLO… BLEAK, MRN: [id number],    Date: 12/30/2023  Time Spent: 55 min In Person @ [facility] Office Time In: 3:00pm Time Out: 3:55pm   Treatment Type: Individual Therapy  Reported Symptoms: Elevated anx/dep & stress for multiple roles Pt is trying to juggle as a Parent  Mental Status Exam: Appearance:  Casual and Neat     Behavior: Appropriate and Sharing  Motor: Normal  Speech/Language:  Clear and Coherent and Normal Rate  Affect: Depressed  Mood: depressed  Thought process: normal  Thought content:   WNL  Sensory/Perceptual disturbances:   WNL  Orientation: oriented to person, place, time/date, and situation  Attention: Good  Concentration: Good  Memory: WNL  Fund of knowledge:  Good  Insight:   Good  Judgment:  Good  Impulse Control: Good   Risk Assessment: Danger to Self:  No Self-injurious Behavior: No Danger to Others: No Duty to Warn:no Physical Aggression / Violence:No  Access to Firearms a concern: No  Gang Involvement:No   Subjective: Pt is anxious for finding the type job she would like to secure to promote her career & provide for her 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 She lives w/her Parents Taska & LT; they are very supportive of her goals. She is currently doing PT Substitute Teaching & is interested in a position w/[employer] she has found. She is concerned for moving due to the need for addt'l resources like Daycare Vouchers, [employer], finances & her levels of fatigue.  There is a custody issue w/her Dtr's situation. Dad Trinna Post lives in [place] & does not contribute financially. Pt is unsure if she can pay to litigate a Custody battle or fight for financial contribution from him. This is causing her stress & anxiety.   Pt is realistic about the situation; she wants things to be kept simple. She is needing Ins for herself & her Dtr & wants a basic pkg to cover healthcare needs.  Today, Everline is filled w/worries & stress, but also maintaining a positive attitude.   Interventions: Family Systems and normalization/validation of Single Parenting & trying to gain independence  Diagnosis:Depression with anxiety  Plan: Irini has a strong value system & appreciates her Parents immensely. She has a life plan she wants to follow through on & this is causing anxiety for her. She will begin to implement the tips to cope w/her anxiety & note her progress. She will complete the Core Values Assessment mailed prior to our last session in order to promote her understanding of herself & her situation. This will assist her to have more confidence in herself & her abilities.   Target Date: 12/29/2023  Progress: 5  Frequency: Once every 2-3 wks  Modality: Claretta Fraise, LMFT

## 2023-11-29 NOTE — Progress Notes (Signed)
                Dawn Graves L [name], LMFT 

## 2023-12-03 ENCOUNTER — Other Ambulatory Visit (HOSPITAL_BASED_OUTPATIENT_CLINIC_OR_DEPARTMENT_OTHER): Payer: Self-pay

## 2023-12-06 ENCOUNTER — Ambulatory Visit: Payer: 59 | Admitting: Behavioral Health

## 2023-12-29 ENCOUNTER — Ambulatory Visit: Payer: 59 | Admitting: Behavioral Health

## 2023-12-29 DIAGNOSIS — F418 Other specified anxiety disorders: Secondary | ICD-10-CM

## 2023-12-29 NOTE — Progress Notes (Signed)
   Deneise Lever, LMFT

## 2023-12-29 NOTE — Progress Notes (Signed)
 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 Pt is very intuitive, calm, & decisive today.   Mental Status Exam: Appearance:  Casual     Behavior: Appropriate and Sharing  Motor: Normal  Speech/Language:  Clear and Coherent and Normal Rate  Affect: Appropriate  Mood: normal  Thought process: normal  Thought content:   WNL  Sensory/Perceptual disturbances:   WNL  Orientation: oriented to person, place, time/date, and situation  Attention: Good  Concentration: Good  Memory: WNL  Fund of knowledge:  Good  Insight:   Good  Judgment:  Good  Impulse Control: Good   Risk Assessment: Danger to Self:  No Self-injurious Behavior: No Danger to Others: No Duty to Warn:no Physical Aggression / Violence:No  Access to Firearms a concern: No  Gang Involvement:No   Subjective: 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 She wants to work w/[employer]. She is looking @ Agency work also & open to this. She is interested in pursuing her Master's Degree in [employer].  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 She is working 3d/wk & her Dtr is in Daycare 2d/wk. She is intentional about taking one day per wk to care for her Dtr & her Verlee [place] cares for [place] 2d/wk.   Pt is exploring what she wants to do next w/her Edu. She may start a Prog w/[employer] or [employer].  Pt also holds a new appreciation for her Parents Tasha & LT for all they are & have done for her.  Interventions: Insight-Oriented  and Family Systems  Diagnosis:Depression with anxiety  Plan: Seona is grateful for the encouragement today & the comments re: her growth, maturity & her attitude of growth. She will cont to record entries in her Notebook as this helps her organize her thoughts.   Target Date: 01/29/2024  Progress: 7  Frequency: Once every 2-3 wks  Modality: Deborrah Fam, LMFT

## 2024-01-17 ENCOUNTER — Ambulatory Visit: Payer: 59 | Admitting: Behavioral Health

## 2024-01-17 DIAGNOSIS — F418 Other specified anxiety disorders: Secondary | ICD-10-CM

## 2024-01-17 NOTE — Progress Notes (Signed)
   Deneise Lever, LMFT

## 2024-01-17 NOTE — Progress Notes (Signed)
[facility] Counselor/Therapist Progress Note  Patient ID: Dawn Graves, MRN: [id number],    Date: 01/17/2024  Time Spent: 30 min Caregility video; Pt is in her car in private & Provider is working remotely from [facility] [employer]. Pt is aware of the risks/limitations of telehealth & consents to Tx today. Time In: 3:15pm Time Out: 4:00pm  Treatment Type: Individual Therapy  Reported Symptoms: Reduction in anx/dep & stress due to work schedule being more flexible & convenient for Pt.  Mental Status Exam: Appearance:  Casual     Behavior: Appropriate and Sharing  Motor: Normal  Speech/Language:  Clear and Coherent  Affect: Appropriate  Mood: normal  Thought process: normal  Thought content:   WNL  Sensory/Perceptual disturbances:   WNL  Orientation: oriented to person, place, time/date, and situation  Attention: Good  Concentration: Good  Memory: WNL  Fund of knowledge:  Good  Insight:   Good  Judgment:  Good  Impulse Control: Good   Risk Assessment: Danger to Self:  No Self-injurious Behavior: No Danger to Others: No Duty to Warn:no Physical Aggression / Violence:No  Access to Firearms a concern: No  Gang Involvement:No   Subjective: Pt is excited for her decision to commit to [employer] online for her Grad Degree in [employer]. Pt is glad to decide for her place to attend for the shortest length of time for the best cost.   Pt has moved past her feelings about her Dtr's Dad & how bad his lack of contribution to her Dtr's support has been thus far. She has put her own hurt & feelings to the side, knowing she will face him in the future. This makes her nervous.  Interventions: Solution-Oriented/Positive Psychology and Ego-Supportive  Diagnosis:Depression with anxiety  Plan: Dawn Graves has shut down unsolicited advice from others regarding her Single Motherhood. She will cont to listen & accept good info & throw away the rest. She has her  moments when she cries, & realizes it can drain her to listen to everyone all the time. Both Parents are respectful of her dec-mkg. She will work towards her [employer] in [employer]. She will record in her Notebook her thoughts, feelings, & anxieties as she moves into her future.   Target Date: 02/26/2024  Progress: 7  Frequency: Once every 2-3 wks  Modality: Claretta Fraise, LMFT

## 2024-02-06 ENCOUNTER — Ambulatory Visit
Admission: EM | Admit: 2024-02-06 | Discharge: 2024-02-06 | Disposition: A | Discharge status: Home / Self Care | Payer: Medicaid Other | Attending: Family Medicine | Admitting: Family Medicine

## 2024-02-06 DIAGNOSIS — B349 Viral infection, unspecified: Secondary | ICD-10-CM | POA: Diagnosis not present

## 2024-02-06 LAB — POC COVID19/FLU A&B COMBO
Covid Antigen, POC: NEGATIVE
Influenza A Antigen, POC: NEGATIVE
Influenza B Antigen, POC: NEGATIVE

## 2024-02-06 LAB — POCT RAPID STREP A (OFFICE): Rapid Strep A Screen: NEGATIVE

## 2024-02-06 MED ORDER — BENZONATATE 200 MG PO CAPS: 200.0000 mg | ORAL_CAPSULE | Freq: Three times a day (TID) | ORAL | 0 refills | Status: DC | PRN

## 2024-02-06 NOTE — ED Triage Notes (Signed)
 Pt reports cough, sore throat, chills, headache x 2 days. Theraflu give some relief.

## 2024-02-06 NOTE — Discharge Instructions (Signed)
 Please treat your symptoms with over the counter  tylenol or ibuprofen, humidifier, and rest.  May take Tessalon as needed for cough.  Viral illnesses can last 7-14 days. Please follow up with your PCP if your symptoms are not improving. Please go to the ER for any worsening symptoms. This includes but is not limited to fever you can not control with tylenol or ibuprofen, you are not able to stay hydrated, you have shortness of breath or chest pain.  Thank you for choosing [facility] for your healthcare needs. I hope you feel better soon!

## 2024-02-06 NOTE — ED Provider Notes (Signed)
 [facility]    CSN: [id number] Arrival date & time: 02/06/24  1322      History   Chief Complaint No chief complaint on file.   HPI Dawn Graves is a 28 y.o. female  presents for evaluation of URI symptoms for 2 days. Patient reports associated symptoms of cough, congestion, sore throat, chills, headache, body aches. Denies N/V/D, fever, ear pain, shortness of breath. Patient does not have a hx of asthma. Patient is not an active smoker.   Reports sick contacts via family.  Pt has taken TheraFlu OTC for symptoms. Pt has no other concerns at this time.   HPI  Past Medical History:  Diagnosis Date   Allergy    typical seasonal allergies, minior food allegeries, and dermatitis.   Immunization, varicella    Lab confirmed immunity on 07/02/2016, results sent for scanning   Medical history non-contributory     Patient Active Problem List   Diagnosis Date Noted   Initiation of oral contraception 01/06/2023   Chorioamnionitis in third trimester, fetus 1 03/21/2022   Vacuum extractor delivery, delivered 03/21/2022   Labor and delivery indication for care or intervention 03/18/2022   Low amniotic fluid, third trimester, fetus 1 02/24/2022   Encounter for supervision of normal first pregnancy in second trimester 09/23/2021   Late prenatal care affecting pregnancy in second trimester 09/23/2021    Past Surgical History:  Procedure Laterality Date   NO PAST SURGERIES      OB History     Gravida  1   Para  1   Term  1   Preterm      AB      Living  1      SAB      IAB      Ectopic      Multiple  0   Live Births  1            Home Medications    Prior to Admission medications   Medication Sig Start Date End Date Taking? Authorizing Provider  benzonatate (TESSALON) 200 MG capsule Take 1 capsule (200 mg total) by mouth 3 (three) times daily as needed. 02/06/24  Yes Radford Pax, NP  Phenylephrine-Pheniramine-DM [facility] COLD & COUGH PO)  Take by mouth.   Yes [provider]  norethindrone-ethinyl estradiol (OVCON-35) 0.4-35 MG-MCG tablet Take 1 tablet by mouth daily. Skip non-hormone days and start a new pack to allow for continuous suppression. 05/07/23   Levie Heritage, DO    Family History Family History  Problem Relation Age of Onset   Hypertension Maternal Grandmother    Diabetes Maternal Grandmother    Hypertension Paternal Grandmother    Diabetes Paternal Grandmother    Drug abuse Paternal Grandmother    Anxiety disorder Father    Depression Father    Sudden death Neg Hx    Heart attack Neg Hx    Cancer Neg Hx     Social History Social History   Tobacco Use   Smoking status: Never    Passive exposure: Never   Smokeless tobacco: Never  Vaping Use   Vaping status: Former   Substances: THC  Substance Use Topics   Alcohol use: Yes    Comment: socially   Drug use: No     Allergies   Strawberry extract and Wound dressing adhesive   Review of Systems Review of Systems  Constitutional:  Positive for chills.  HENT:  Positive for congestion and sore throat.  Respiratory:  Positive for cough.      Physical Exam Triage Vital Signs ED Triage Vitals  Encounter Vitals Group     BP 02/06/24 1430 118/89     Systolic BP Percentile --      Diastolic BP Percentile --      Pulse Rate 02/06/24 1430 (!) 113     Resp 02/06/24 1430 16     Temp 02/06/24 1430 99.8 F (37.7 C)     Temp Source 02/06/24 1430 Oral     SpO2 02/06/24 1430 96 %     Weight --      Height --      Head Circumference --      Peak Flow --      Pain Score 02/06/24 1434 8     Pain Loc --      Pain Education --      Exclude from Growth Chart --    No data found.  Updated Vital Signs BP 118/89 (BP Location: Left Arm)   Pulse (!) 113   Temp 99.8 F (37.7 C) (Oral)   Resp 16   LMP 01/29/2024 (Exact Date)   SpO2 96%   Breastfeeding No   Visual Acuity Right Eye Distance:   Left Eye Distance:   Bilateral  Distance:    Right Eye Near:   Left Eye Near:    Bilateral Near:     Physical Exam Vitals and nursing note reviewed.  Constitutional:      General: She is not in acute distress.    Appearance: She is well-developed. She is not ill-appearing.  HENT:     Head: Normocephalic and atraumatic.     Right Ear: Tympanic membrane and ear canal normal.     Left Ear: Tympanic membrane and ear canal normal.     Nose: Congestion present.     Mouth/Throat:     Mouth: Mucous membranes are moist.     Pharynx: Oropharynx is clear. Uvula midline. Posterior oropharyngeal erythema present.     Tonsils: No tonsillar exudate or tonsillar abscesses.  Eyes:     Conjunctiva/sclera: Conjunctivae normal.     Pupils: Pupils are equal, round, and reactive to light.  Cardiovascular:     Rate and Rhythm: Normal rate and regular rhythm.     Heart sounds: Normal heart sounds.  Pulmonary:     Effort: Pulmonary effort is normal.     Breath sounds: Normal breath sounds. No wheezing or rhonchi.  Musculoskeletal:     Cervical back: Normal range of motion and neck supple.  Lymphadenopathy:     Cervical: No cervical adenopathy.  Skin:    General: Skin is warm and dry.  Neurological:     General: No focal deficit present.     Mental Status: She is alert and oriented to person, place, and time.  Psychiatric:        Mood and Affect: Mood normal.        Behavior: Behavior normal.      UC Treatments / Results  Labs (all labs ordered are listed, but only abnormal results are displayed) Labs Reviewed  POCT RAPID STREP A (OFFICE)  POC COVID19/FLU A&B COMBO    EKG   Radiology No results found.  Procedures Procedures (including critical care time)  Medications Ordered in UC Medications - No data to display  Initial Impression / Assessment and Plan / UC Course  I have reviewed the triage vital signs and the nursing notes.  Pertinent labs &  imaging results that were available during my care of the  patient were reviewed by me and considered in my medical decision making (see chart for details).     Reviewed exam and symptoms with patient.  No red flags.  Negative rapid flu COVID and strep testing.  Discussed viral illness and symptomatic treatment.  Tessalon as needed for cough.  PCP follow-up if symptoms do not improve.  ER precautions reviewed. Final Clinical Impressions(s) / UC Diagnoses   Final diagnoses:  Viral illness     Discharge Instructions       Please treat your symptoms with over the counter  tylenol or ibuprofen, humidifier, and rest.  May take Tessalon as needed for cough.  Viral illnesses can last 7-14 days. Please follow up with your PCP if your symptoms are not improving. Please go to the ER for any worsening symptoms. This includes but is not limited to fever you can not control with tylenol or ibuprofen, you are not able to stay hydrated, you have shortness of breath or chest pain.  Thank you for choosing [facility] for your healthcare needs. I hope you feel better soon!      ED Prescriptions     Medication Sig Dispense Auth. Provider   benzonatate (TESSALON) 200 MG capsule Take 1 capsule (200 mg total) by mouth 3 (three) times daily as needed. 20 capsule Radford Pax, NP      PDMP not reviewed this encounter.   Radford Pax, NP 02/06/24 [phone]

## 2024-02-14 ENCOUNTER — Ambulatory Visit: Payer: 59 | Admitting: Behavioral Health

## 2024-02-14 DIAGNOSIS — F418 Other specified anxiety disorders: Secondary | ICD-10-CM | POA: Diagnosis not present

## 2024-02-14 NOTE — Progress Notes (Signed)
 [facility] Counselor/Therapist Progress Note  Patient ID: Dawn Graves, MRN: [id number],    Date: 02/14/2024  Time Spent: 55 min Caregility video; Pt is home in private & Provider is working remotely from [employer]. Pt is aware of the risks/limitations of telehealth & consents to Tx today. Time In: 4:00pm Time Out: 4:55pm   Treatment Type: Individual Therapy  Reported Symptoms: Pt is adjusting to the new work setting for 2-3 wks now.   Mental Status Exam: Appearance:  Casual     Behavior: Appropriate and Sharing  Motor: Normal  Speech/Language:  Clear and Coherent and Normal Rate  Affect: Appropriate  Mood: normal  Thought process: normal  Thought content:   WNL  Sensory/Perceptual disturbances:   WNL  Orientation: oriented to person, place, time/date, and situation  Attention: Good  Concentration: Good  Memory: WNL  Fund of knowledge:  Good  Insight:   Good  Judgment:  Good  Impulse Control: Good   Risk Assessment: Danger to Self:  No Self-injurious Behavior: No Danger to Others: No Duty to Warn:no Physical Aggression / Violence:No  Access to Firearms a concern: No  Gang Involvement:No   Subjective: Pt is in a new job & adjusting to the setting, expectations, & treatment. She is encountering strong Staff w/good offerings & good input. Her schedule is 8-1:00pm M-F.   Parents have assisted Pt to thrive through this year w/her care & care for 26 year old Dtr.   Interventions: Insight-Oriented and Family Systems  Diagnosis:Depression with anxiety  Plan: Analina has been adjusting to a new work setting & enjoying it. She has been interested & engaged since the beginning. Her one Ct is getting to know her. She has learned to "go with the flow", & "adapting to the unpredictability" helps her grow. She realizes when she was sick for 6 days, it took a toll on her. She even got angry w/herself for getting ill & still having responsibility for her Dtr.  Realizing overwhelm still happens & makes reaching out for help difficult.   Target Date: 3/130/2025  Progress: 5  Frequency: Once every 2-3 wks  Modality: Claretta Fraise, LMFT

## 2024-02-14 NOTE — Progress Notes (Signed)
   Dawn Lever, LMFT

## 2024-03-08 ENCOUNTER — Ambulatory Visit (INDEPENDENT_AMBULATORY_CARE_PROVIDER_SITE_OTHER): Admitting: Behavioral Health

## 2024-03-08 DIAGNOSIS — F418 Other specified anxiety disorders: Secondary | ICD-10-CM

## 2024-03-08 NOTE — Progress Notes (Signed)
 [facility] Counselor/Therapist Progress Note  Patient ID: Jeslyn D Mcdougle, MRN: [id number],    Date: 07/01/2024  Time Spent: 50 min Caregility video: Pt is home in private & Provider working remotely from [employer]. Pt is aware of the risks/limitations of telehealth & consents to Tx today.  Time In: 4:00pm Time Out: 4:50pm   Treatment Type: Individual Therapy  Reported Symptoms: Elevated anx/dep & stressors due to care of 26yo Dtr, Parental residence, & difficult dvlpm'tl stage of Dtr.  Mental Status Exam: Appearance:  Casual     Behavior: Appropriate and Sharing  Motor: Normal  Speech/Language:  Clear and Coherent  Affect: Appropriate  Mood: normal  Thought process: normal  Thought content:   WNL  Sensory/Perceptual disturbances:   WNL  Orientation: oriented to person, place, time/date, and situation  Attention: Good  Concentration: Good  Memory: WNL  Fund of knowledge:  Good  Insight:   Good  Judgment:  Good  Impulse Control: Good   Risk Assessment: Danger to Self:  No Self-injurious Behavior: No Danger to Others: No Duty to Warn:no Physical Aggression / Violence:No  Access to Firearms a concern: No  Gang Involvement:No   Subjective: Pt is doing, much better since last visit. I have been outside & enjoying the warm weather, I am keeping positive & moving forward. She has purposely decided on no LT relationship w/BF Amik. He is draining & her focus in on the health & well-being of her Dtr who is in Daycare & flourishing.   Pt has new job that is from 8-1 daily. This schedule is very helpful & supports her Motherhood. 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 There is, never a dull moment. Dtr is very expressive & communicative. This makes Pt happy. Her Parents are letting her make decisions & figure things out; this is empowering.   Interventions: Family Systems and Interpersonal  Diagnosis:Depression with  anxiety  Plan: Layni will cont her positive approach to her current life situation to maintain her focus & inc her abilities to make good decisions for herself & her Dtr, to be present & appreciative of her Parents' support & to keep her perspective on the needs of her Family as she moves forward.   Target Date: 03/28/2024  Progress: 7  Frequency: Once monthly   Modality: Kennis Richerd LITTIE Hollace, LMFT

## 2024-03-08 NOTE — Progress Notes (Signed)
   Dawn Lever, LMFT

## 2024-03-27 ENCOUNTER — Other Ambulatory Visit (HOSPITAL_COMMUNITY)
Admission: RE | Admit: 2024-03-27 | Discharge: 2024-03-27 | Disposition: A | Discharge status: Home / Self Care | Source: Ambulatory Visit | Attending: Obstetrics and Gynecology | Admitting: Obstetrics and Gynecology

## 2024-03-27 ENCOUNTER — Ambulatory Visit (INDEPENDENT_AMBULATORY_CARE_PROVIDER_SITE_OTHER): Admitting: Obstetrics and Gynecology

## 2024-03-27 VITALS — BP 121/88 | HR 77 | Ht 71.0 in | Wt 121.0 lb

## 2024-03-27 DIAGNOSIS — Z01419 Encounter for gynecological examination (general) (routine) without abnormal findings: Secondary | ICD-10-CM | POA: Diagnosis not present

## 2024-03-27 DIAGNOSIS — Z124 Encounter for screening for malignant neoplasm of cervix: Secondary | ICD-10-CM | POA: Diagnosis not present

## 2024-03-27 DIAGNOSIS — Z1339 Encounter for screening examination for other mental health and behavioral disorders: Secondary | ICD-10-CM

## 2024-03-27 NOTE — Progress Notes (Signed)
 ANNUAL EXAM Patient name: Dawn Graves MRN [id number]  Date of birth: 11-20-1996 Chief Complaint:   Annual Exam  History of Present Illness:   Dawn Graves is a 28 y.o. G25P1001  female being seen today for a routine annual exam.   Current complaints: none, recently stopped her OCPs, declines contraception today  Gyn History: Patient's last menstrual period was 03/02/2024 (approximate). Sexually active: yes/no: No, not currently Contraception: abstinence Last pap:  Lab Results  Component Value Date   DIAGPAP  09/23/2021    - Negative for intraepithelial lesion or malignancy (NILM)   History of abnormal pap: No Periods: regular      03/27/2024    4:00 PM 09/17/2023   11:12 AM 02/17/2022   10:50 AM 12/16/2021   10:40 AM 09/23/2021    9:53 AM  Depression screen PHQ 2/9  Decreased Interest 0 0 0 0 0  Down, Depressed, Hopeless 0 0 0 0 0  PHQ - 2 Score 0 0 0 0 0  Altered sleeping 0 0 1 1 1   Tired, decreased energy 0 1 1 1 1   Change in appetite 0 0 0 0 0  Feeling bad or failure about yourself  0 0 0 0 0  Trouble concentrating 0 0 0 1 0  Moving slowly or fidgety/restless 0 0 0 0 0  Suicidal thoughts 0 0 0 0 0  PHQ-9 Score 0 1 2 3 2   Difficult doing work/chores  Not difficult at all           03/27/2024    4:01 PM 09/17/2023   11:13 AM 02/17/2022   10:50 AM 12/16/2021   10:41 AM  GAD 7 : Generalized Anxiety Score  Nervous, Anxious, on Edge 0 1 1 0  Control/stop worrying 0 0 0 0  Worry too much - different things 0 1 0 0  Trouble relaxing 0 1 0 0  Restless 0 0 0 0  Easily annoyed or irritable 1 1 1 2   Afraid - awful might happen 0 0 0 0  Total GAD 7 Score 1 4 2 2   Anxiety Difficulty  Somewhat difficult       Review of Systems:   Pertinent items are noted in HPI Denies any headaches, blurred vision, fatigue, shortness of breath, chest pain, abdominal pain, abnormal vaginal discharge/itching/odor/irritation, problems with periods, bowel movements, urination, or  intercourse unless otherwise stated above. Pertinent History Reviewed:  Reviewed past medical,surgical, social and family history.  Reviewed problem list, medications and allergies. Physical Assessment:   Vitals:   03/27/24 1552  BP: 121/88  Pulse: 77  Weight: 121 lb (54.9 kg)  Height: 5\' 11"  (1.803 m)  Body mass index is 16.88 kg/m.        Physical Examination:   General appearance - well appearing, and in no distress  Mental status - alert, oriented to person, place, and time  Psych:  She has a normal mood and affect  Skin - warm and dry, normal color, no suspicious lesions noted  Chest - effort normal, all lung fields clear to auscultation bilaterally  Heart - normal rate and regular rhythm  Neck:  midline trachea, no thyromegaly or nodules  Breasts - breasts appear normal, no suspicious masses, no skin or nipple changes or  axillary nodes  Abdomen - soft, nontender, nondistended, no masses or organomegaly  Pelvic - VULVA: normal appearing vulva with no masses, tenderness or lesions  VAGINA: normal appearing vagina with normal color and discharge, no  lesions  CERVIX: normal appearing cervix without discharge or lesions, no CMT  Thin prep pap is done with reflex HR HPV cotesting  UTERUS: uterus is felt to be normal size, shape, consistency and nontender   ADNEXA: No adnexal masses or tenderness noted.  Extremities:  No swelling or varicosities noted  Chaperone present for exam  No results found for this or any previous visit (from the past 24 hours).  Assessment & Plan:  1. Well woman exam with routine gynecological exam (Primary) Pap Normal breast exam Counseled on contraception, declines at this time, plans to return if she becomes sexually active again Declines STI testing  2. Cervical cancer screening - Cytology - PAP( [place])    Follow-up: No follow-ups on file.  Marci Setter, MD, FACOG Obstetrician & Gynecologist, [facility], [facility]

## 2024-03-30 LAB — CYTOLOGY - PAP: Diagnosis: NEGATIVE

## 2024-04-03 ENCOUNTER — Encounter: Payer: Self-pay | Admitting: Obstetrics and Gynecology

## 2024-04-04 ENCOUNTER — Ambulatory Visit: Admitting: Behavioral Health

## 2024-04-04 NOTE — Progress Notes (Unsigned)
   Deneise Lever, LMFT

## 2024-05-02 ENCOUNTER — Ambulatory Visit: Admitting: Behavioral Health

## 2024-05-08 ENCOUNTER — Other Ambulatory Visit: Payer: Self-pay | Admitting: Family Medicine

## 2024-05-08 DIAGNOSIS — Z3040 Encounter for surveillance of contraceptives, unspecified: Secondary | ICD-10-CM

## 2024-05-09 ENCOUNTER — Other Ambulatory Visit (HOSPITAL_BASED_OUTPATIENT_CLINIC_OR_DEPARTMENT_OTHER): Payer: Self-pay

## 2024-05-09 MED ORDER — BALZIVA 0.4-35 MG-MCG PO TABS: 1.0000 | ORAL_TABLET | Freq: Every day | ORAL | 3 refills | Status: AC

## 2024-07-12 ENCOUNTER — Ambulatory Visit (INDEPENDENT_AMBULATORY_CARE_PROVIDER_SITE_OTHER): Admitting: Behavioral Health

## 2024-07-12 DIAGNOSIS — F418 Other specified anxiety disorders: Secondary | ICD-10-CM | POA: Diagnosis not present

## 2024-07-12 NOTE — Progress Notes (Signed)
 [facility] Counselor/Therapist Progress Note  Patient ID: Dawn Graves, MRN: [id number],    Date: 07/12/2024  Time Spent: 55 min Caregility video; Pt is by the [facility] in her car in private & Provider is working remotely from [employer]. Pt is aware of the risks/limitations of telehealth & consents to Tx today.  Time In: 2:00pm Time Out: 2:55pm  Treatment Type: Individual Therapy  Reported Symptoms: Reduction in anx/dep & since job has been going well & she is considering moving out of her Parents' home.  Mental Status Exam: Appearance:  Casual     Behavior: Appropriate and Sharing  Motor: Normal  Speech/Language:  Clear and Coherent  Affect: Appropriate  Mood: normal  Thought process: normal  Thought content:   WNL  Sensory/Perceptual disturbances:   WNL  Orientation: oriented to person, place, time/date, and situation  Attention: Good  Concentration: Good  Memory: WNL  Fund of knowledge:  Good  Insight:   Good  Judgment:  Good  Impulse Control: Good   Risk Assessment: Danger to Self:  No Self-injurious Behavior: No Danger to Others: No Duty to Warn:no Physical Aggression / Violence:No  Access to Firearms a concern: No  Gang Involvement:No   Subjective: Pt is doing well w/her job. She has completed her Cert & has rec'd a raise. She is considering a move out of her Parent's home. She feels they need to have room to grow. She has eyes on a 1 Brm/1Bath or a 2Brm/1 Bath. She will do the right thing when the time to make a decision comes.   Parents are supportive & Pt's Ins coverage will end in Aug of this year when she turns 28yo.   Pt is concerned for her own possible Dx of any type of neurological disorder of any type. Her Dad is a Cytogeneticist being treated medically. She resonates w/some of other ppl's sensory & mood issues.   Pt is grieving the loss of a close female friend who has been in her & Dtr Zora's life for several years. She is upset over this  loss.   Interventions: Insight-Oriented and Family Systems  Diagnosis:Depression with anxiety  Plan: Danilynn will take the time to write in a Notebook prior to our next session so she can process the relationship w/Nathaniel Terrell) she mentioned today. She has strong feelings that need attn & lots will be happening in the next few wks. We will also do a Mood Questionnaire to determine if she has severe mood swings.   Target Date: 07/28/2024  Progress: 6  Frequency: Once every 2-3 wks  Modality: Kennis Richerd LITTIE Hollace, LMFT

## 2024-07-24 ENCOUNTER — Ambulatory Visit (INDEPENDENT_AMBULATORY_CARE_PROVIDER_SITE_OTHER): Admitting: Behavioral Health

## 2024-07-24 DIAGNOSIS — F418 Other specified anxiety disorders: Secondary | ICD-10-CM

## 2024-07-24 NOTE — Progress Notes (Signed)
 [facility] Counselor/Therapist Progress Note  Patient ID: Dawn Graves, MRN: [id number],    Date: 07/24/2024  Time Spent: 50 min Caregility video; Pt is In [employer] [place] [facility] [employer] near [employer] working remotely from [employer]. Pt is aware of the risks/limitations of telehealth & consents to Tx today. Time In: 1:00pm Time Out: 1:50pm  Treatment Type: Individual Therapy  Reported Symptoms: Elevated anx/dep & stressors  Mental Status Exam: Appearance:  Casual     Behavior: Appropriate and Sharing  Motor: Normal  Speech/Language:  Clear and Coherent  Affect: Appropriate  Mood: anxious  Thought process: normal  Thought content:   WNL  Sensory/Perceptual disturbances:   WNL  Orientation: oriented to person, place, time/date, and situation  Attention: Good  Concentration: Good  Memory: WNL  Fund of knowledge:  Good  Insight:   Good  Judgment:  Good  Impulse Control: Good   Risk Assessment: Danger to Self:  No Self-injurious Behavior: No Danger to Others: No Duty to Warn:no Physical Aggression / Violence:No  Access to Firearms a concern: No  Gang Involvement:No   Subjective: Pt is trying to deal w/her busy life w/her Dtr Nan, her Parents, & her 2 younger Siblings; younger Bros who is 16yo LIDIA) & younger Str who is 26yo (Ashe). 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   Pt has not heard from her Dtr's Father Denson) for any type of support. He has made no efforts to reach out to this date to meet his Dtr Zora.   Interventions: Family Systems  Diagnosis:Depression with anxiety  Plan: Bessye will cont to write in her Notebook as it is helping her process her feelings which can get overwhelming lately. We will cont to process her feelings, self-care, & efforts for her Dtr. She will reach out & contact the Child Custody Worker to ask about the status of her Art gallery manager. She will cont to leave work @ work to reduce her stress & she will be  transparent about her new BF Ray who is a Environmental manager. She will enjoy the rest of her week & her Gwendlyn celebration on Friday!  Target Date: 08/12/2024  Progress: 6  Frequency: Once every 2-3 wks  Modality: Kennis Richerd LITTIE Hollace, LMFT

## 2024-08-08 ENCOUNTER — Ambulatory Visit
Admission: EM | Admit: 2024-08-08 | Discharge: 2024-08-08 | Disposition: A | Discharge status: Home / Self Care | Attending: Family Medicine | Admitting: Family Medicine

## 2024-08-08 DIAGNOSIS — W503XXA Accidental bite by another person, initial encounter: Secondary | ICD-10-CM | POA: Diagnosis not present

## 2024-08-08 DIAGNOSIS — M79631 Pain in right forearm: Secondary | ICD-10-CM | POA: Diagnosis not present

## 2024-08-08 MED ORDER — AMOXICILLIN-POT CLAVULANATE 875-125 MG PO TABS: 1.0000 | ORAL_TABLET | Freq: Two times a day (BID) | ORAL | 0 refills | Status: DC

## 2024-08-08 MED ORDER — IBUPROFEN 600 MG PO TABS: 600.0000 mg | ORAL_TABLET | Freq: Four times a day (QID) | ORAL | 0 refills | Status: AC | PRN
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
## 2024-08-08 NOTE — Discharge Instructions (Signed)
 Use ibuprofen  for pain and inflammation. Over the next 48 hours you can apply icing 20 minutes on 2 hours off as your schedule allows. If you develop fever, redness, a hot sensation, worsening pain over the bite wound area, then start the antibiotic.
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
## 2024-08-10 ENCOUNTER — Ambulatory Visit: Admitting: Behavioral Health

## 2024-08-10 DIAGNOSIS — F418 Other specified anxiety disorders: Secondary | ICD-10-CM
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
## 2025-01-15 ENCOUNTER — Emergency Department (HOSPITAL_BASED_OUTPATIENT_CLINIC_OR_DEPARTMENT_OTHER)

## 2025-01-15 ENCOUNTER — Encounter (HOSPITAL_BASED_OUTPATIENT_CLINIC_OR_DEPARTMENT_OTHER): Payer: Self-pay

## 2025-01-15 ENCOUNTER — Emergency Department (HOSPITAL_BASED_OUTPATIENT_CLINIC_OR_DEPARTMENT_OTHER)
Admission: EM | Admit: 2025-01-15 | Discharge: 2025-01-15 | Disposition: A | Discharge status: Home / Self Care | Attending: Emergency Medicine | Admitting: Emergency Medicine

## 2025-01-15 ENCOUNTER — Other Ambulatory Visit (HOSPITAL_BASED_OUTPATIENT_CLINIC_OR_DEPARTMENT_OTHER): Payer: Self-pay

## 2025-01-15 DIAGNOSIS — R1030 Lower abdominal pain, unspecified: Secondary | ICD-10-CM | POA: Insufficient documentation

## 2025-01-15 DIAGNOSIS — R102 Pelvic and perineal pain unspecified side: Secondary | ICD-10-CM | POA: Insufficient documentation

## 2025-01-15 DIAGNOSIS — M549 Dorsalgia, unspecified: Secondary | ICD-10-CM | POA: Insufficient documentation

## 2025-01-15 DIAGNOSIS — R197 Diarrhea, unspecified: Secondary | ICD-10-CM | POA: Insufficient documentation

## 2025-01-15 DIAGNOSIS — R112 Nausea with vomiting, unspecified: Secondary | ICD-10-CM | POA: Insufficient documentation

## 2025-01-15 LAB — CBC WITH DIFFERENTIAL/PLATELET
Abs Immature Granulocytes: 0.02 10*3/uL (ref 0.00–0.07)
Basophils Absolute: 0 10*3/uL (ref 0.0–0.1)
Basophils Relative: 0 %
Eosinophils Absolute: 0.1 10*3/uL (ref 0.0–0.5)
Eosinophils Relative: 1 %
HCT: 37.9 % (ref 36.0–46.0)
Hemoglobin: 13 g/dL (ref 12.0–15.0)
Immature Granulocytes: 0 %
Lymphocytes Relative: 18 %
Lymphs Abs: 1.2 10*3/uL (ref 0.7–4.0)
MCH: 31.3 pg (ref 26.0–34.0)
MCHC: 34.3 g/dL (ref 30.0–36.0)
MCV: 91.3 fL (ref 80.0–100.0)
Monocytes Absolute: 0.4 10*3/uL (ref 0.1–1.0)
Monocytes Relative: 6 %
Neutro Abs: 5 10*3/uL (ref 1.7–7.7)
Neutrophils Relative %: 75 %
Platelets: 160 10*3/uL (ref 150–400)
RBC: 4.15 MIL/uL (ref 3.87–5.11)
RDW: 11.7 % (ref 11.5–15.5)
WBC: 6.7 10*3/uL (ref 4.0–10.5)
nRBC: 0 % (ref 0.0–0.2)

## 2025-01-15 LAB — COMPREHENSIVE METABOLIC PANEL WITH GFR
ALT: 12 U/L (ref 0–44)
AST: 21 U/L (ref 15–41)
Albumin: 4.6 g/dL (ref 3.5–5.0)
Alkaline Phosphatase: 66 U/L (ref 38–126)
Anion gap: 11 (ref 5–15)
BUN: 9 mg/dL (ref 6–20)
CO2: 23 mmol/L (ref 22–32)
Calcium: 9.5 mg/dL (ref 8.9–10.3)
Chloride: 101 mmol/L (ref 98–111)
Creatinine, Ser: 0.85 mg/dL (ref 0.44–1.00)
GFR, Estimated: >60 mL/min
Glucose, Bld: 98 mg/dL (ref 70–99)
Potassium: 3.4 mmol/L — ABNORMAL LOW (ref 3.5–5.1)
Sodium: 135 mmol/L (ref 135–145)
Total Bilirubin: 0.7 mg/dL (ref 0.0–1.2)
Total Protein: 7.6 g/dL (ref 6.5–8.1)

## 2025-01-15 LAB — URINALYSIS, W/ REFLEX TO CULTURE (INFECTION SUSPECTED)
Glucose, UA: NEGATIVE mg/dL
Hgb urine dipstick: NEGATIVE
Ketones, ur: 80 mg/dL — AB
Leukocytes,Ua: NEGATIVE
Nitrite: NEGATIVE
Protein, ur: 30 mg/dL — AB
Specific Gravity, Urine: >=1.03 (ref 1.005–1.030)
pH: 5.5 (ref 5.0–8.0)

## 2025-01-15 LAB — HCG, SERUM, QUALITATIVE: Preg, Serum: NEGATIVE

## 2025-01-15 LAB — RESP PANEL BY RT-PCR (RSV, FLU A&B, COVID)  RVPGX2
Influenza A by PCR: NEGATIVE
Influenza B by PCR: NEGATIVE
Resp Syncytial Virus by PCR: NEGATIVE
SARS Coronavirus 2 by RT PCR: NEGATIVE

## 2025-01-15 LAB — LIPASE, BLOOD: Lipase: 11 U/L (ref 11–51)

## 2025-01-15 MED ORDER — SODIUM CHLORIDE 0.9 % IV BOLUS
1000.0000 mL | Freq: Once | INTRAVENOUS | Status: AC
  Administered 2025-01-15: 1000 mL via INTRAVENOUS

## 2025-01-15 MED ORDER — MORPHINE SULFATE (PF) 4 MG/ML IV SOLN
4.0000 mg | Freq: Once | INTRAVENOUS | Status: AC
  Administered 2025-01-15: 4 mg via INTRAVENOUS
  Filled 2025-01-15: qty 1

## 2025-01-15 MED ORDER — ONDANSETRON HCL 4 MG/2ML IJ SOLN
4.0000 mg | Freq: Once | INTRAMUSCULAR | Status: AC
  Administered 2025-01-15: 4 mg via INTRAVENOUS
  Filled 2025-01-15: qty 2

## 2025-01-15 MED ORDER — HYDROCODONE-ACETAMINOPHEN 5-325 MG PO TABS
1.0000 | ORAL_TABLET | Freq: Four times a day (QID) | ORAL | 0 refills | Status: AC | PRN
  Filled 2025-01-15: qty 12, 3d supply, fill #0

## 2025-01-15 MED ORDER — IOHEXOL 300 MG/ML  SOLN
75.0000 mL | Freq: Once | INTRAMUSCULAR | Status: AC | PRN
  Administered 2025-01-15: 75 mL via INTRAVENOUS

## 2025-01-15 MED ORDER — ONDANSETRON 4 MG PO TBDP
4.0000 mg | ORAL_TABLET | Freq: Three times a day (TID) | ORAL | 0 refills | Status: AC | PRN
  Filled 2025-01-15 (×2): qty 20, 7d supply, fill #0

## 2025-01-15 NOTE — ED Provider Notes (Signed)
 "  [facility]  Provider Note  CSN: [id number] Arrival date & time: 01/15/25 0109  History Chief Complaint  Patient presents with   Abdominal Pain   Back Pain    Dawn Graves is a 29 y.o. female with no significant PMH reports onset of lower abdominal pain, vomiting, diarrhea and back pain onset about 24 hours ago. No blood. No prior abdominal surgeries. No recent EtOH use.    Home Medications Prior to Admission medications  Medication Sig Start Date End Date Taking? Authorizing Provider  HYDROcodone -acetaminophen  (NORCO/VICODIN) 5-325 MG tablet Take 1 tablet by mouth every 6 (six) hours as needed for severe pain (pain score 7-10). 01/15/25  Yes Roselyn Carlin NOVAK, MD  ondansetron  (ZOFRAN -ODT) 4 MG disintegrating tablet Take 1 tablet (4 mg total) by mouth every 8 (eight) hours as needed for nausea or vomiting. 01/15/25  Yes Roselyn Carlin NOVAK, MD  ibuprofen  (ADVIL ) 600 MG tablet Take 1 tablet (600 mg total) by mouth every 6 (six) hours as needed. 08/08/24   Christopher Savannah, PA-C  norethindrone -ethinyl estradiol  (BALZIVA ) 0.4-35 MG-MCG tablet Take 1 tablet by mouth daily. Skip non-hormone days and start a new pack to allow for continuous suppression. 05/09/24   Stinson, Jacob J, DO     Allergies    Strawberry extract and Wound dressing adhesive   Review of Systems   Review of Systems Please see HPI for pertinent positives and negatives  Physical Exam BP 131/82   Pulse (!) 50   Temp 98.1 F (36.7 C) (Oral)   Resp 10   Ht 5' 11 (1.803 m)   Wt 56.7 kg   SpO2 100%   BMI 17.43 kg/m   Physical Exam Vitals and nursing note reviewed.  Constitutional:      Appearance: Normal appearance.  HENT:     Head: Normocephalic and atraumatic.     Nose: Nose normal.     Mouth/Throat:     Mouth: Mucous membranes are moist.  Eyes:     Extraocular Movements: Extraocular movements intact.     Conjunctiva/sclera: Conjunctivae normal.   Cardiovascular:     Rate and Rhythm: Normal rate.  Pulmonary:     Effort: Pulmonary effort is normal.     Breath sounds: Normal breath sounds.  Abdominal:     General: Abdomen is flat.     Palpations: Abdomen is soft.     Tenderness: There is abdominal tenderness in the right lower quadrant and left lower quadrant. There is no guarding. Negative signs include Murphy's sign and McBurney's sign.  Musculoskeletal:        General: No swelling. Normal range of motion.     Cervical back: Neck supple.  Skin:    General: Skin is warm and dry.  Neurological:     General: No focal deficit present.     Mental Status: She is alert.  Psychiatric:        Mood and Affect: Mood normal.     ED Results / Procedures / Treatments   EKG EKG Interpretation Date/Time:  Monday January 15 2025 01:24:17 EST Ventricular Rate:  62 PR Interval:  186 QRS Duration:  101 QT Interval:  410 QTC Calculation: 417 R Axis:   94  Text Interpretation: Sinus or ectopic atrial rhythm Consider right ventricular hypertrophy No old tracing to compare Confirmed by Roselyn Carlin [phone]) on 01/15/2025 1:31:02 AM  Procedures Procedures  Medications Ordered in the ED Medications  ondansetron  (ZOFRAN ) injection 4 mg (  4 mg Intravenous Given 01/15/25 0148)  morphine  (PF) 4 MG/ML injection 4 mg (4 mg Intravenous Given 01/15/25 0150)  sodium chloride  0.9 % bolus 1,000 mL (0 mLs Intravenous Stopped 01/15/25 0257)  iohexol  (OMNIPAQUE ) 300 MG/ML solution 75 mL (75 mLs Intravenous Contrast Given 01/15/25 0253)    Initial Impression and Plan  Patient here with abdominal pain, NVD. Suspect a viral GI illness, but given her tenderness, will check labs and plan CT to rule out appendicitis or other acute surgical process. Pain/nausea meds and IVF for comfort.   ED Course   Clinical Course as of 01/15/25 0330  Mon Jan 15, 2025  0201 Covid/Flu/RSV swab is neg.  [CS]  0206 CBC is normal.  [CS]  0219 HCG is neg. [CS]  0224 CMP and  lipase are normal [CS]  0310 UA is clear.  [CS]  0324 I personally viewed the images from radiology studies and agree with radiologist interpretation: CT shows findings of possible colitis which is consistent with her symptoms, suspect viral cause with labs normal and no fever. Also some possible pelvic vascular congestion. Denies history of pelvic pain other than dysmenorrhea. Unclear if this is related. Otherwise no acute findings. Her pain is well controlled. No further vomiting. Plan discharge with Rx for zofran , norco as needed for symptom relief. Advance diet as tolerated. Follow up with Gyn and/or PCP if symptoms continue. RTED for any worsening or other concerns.  [CS]    Clinical Course User Index [CS] Roselyn Carlin NOVAK, MD     MDM Rules/Calculators/[name] Medical Decision Making Problems Addressed: Nausea vomiting and diarrhea: acute illness or injury Pelvic pain: acute illness or injury  Amount and/or Complexity of Data Reviewed Labs: ordered. Decision-making details documented in ED Course. Radiology: ordered and independent interpretation performed. Decision-making details documented in ED Course.  Risk Prescription drug management. Parenteral controlled substances.     Final Clinical Impression(s) / ED Diagnoses Final diagnoses:  Nausea vomiting and diarrhea  Pelvic pain    Rx / DC Orders ED Discharge Orders          Ordered    ondansetron  (ZOFRAN -ODT) 4 MG disintegrating tablet  Every 8 hours PRN        01/15/25 0329    HYDROcodone -acetaminophen  (NORCO/VICODIN) 5-325 MG tablet  Every 6 hours PRN        01/15/25 0329             Roselyn Carlin NOVAK, MD 01/15/25 0330  "

## 2025-01-15 NOTE — ED Triage Notes (Signed)
 Patient reports waking yesterday with abdominal pain and back pain at 0500, the pain decreased but never fully went away. Patient reports tonight around 9 pm her pain worsened again and has progressively worsened since then.  Patient reports LLQ abdominal pain with nausea, vomiting, and diarrhea.  Unable to keep anything down all day.  Aox4, resp even and unlabored, vss.
